# Patient Record
Sex: Male | Born: 1965 | Race: Black or African American | Hispanic: No | Marital: Married | Smoking: Current every day smoker
Health system: Southern US, Community
[De-identification: ages and names within clinical notes are randomized; demographics above are authoritative.]

## PROBLEM LIST (undated history)

## (undated) DIAGNOSIS — M1990 Unspecified osteoarthritis, unspecified site: Secondary | ICD-10-CM

## (undated) DIAGNOSIS — K4090 Unilateral inguinal hernia, without obstruction or gangrene, not specified as recurrent: Secondary | ICD-10-CM

## (undated) DIAGNOSIS — K219 Gastro-esophageal reflux disease without esophagitis: Secondary | ICD-10-CM

## (undated) DIAGNOSIS — R569 Unspecified convulsions: Secondary | ICD-10-CM

## (undated) HISTORY — PX: HERNIA REPAIR: SHX51

## (undated) HISTORY — PX: KNEE SURGERY: SHX244

## (undated) HISTORY — PX: BACK SURGERY: SHX140

---

## 2014-02-17 ENCOUNTER — Emergency Department (HOSPITAL_COMMUNITY): Payer: Medicare Other

## 2014-02-17 ENCOUNTER — Emergency Department (HOSPITAL_COMMUNITY)
Admission: EM | Admit: 2014-02-17 | Discharge: 2014-02-17 | Disposition: A | Discharge status: Home / Self Care | Payer: Medicare Other | Attending: Emergency Medicine | Admitting: Emergency Medicine

## 2014-02-17 ENCOUNTER — Encounter (HOSPITAL_COMMUNITY): Payer: Self-pay | Admitting: *Deleted

## 2014-02-17 DIAGNOSIS — R1032 Left lower quadrant pain: Secondary | ICD-10-CM | POA: Diagnosis present

## 2014-02-17 DIAGNOSIS — Z720 Tobacco use: Secondary | ICD-10-CM | POA: Insufficient documentation

## 2014-02-17 DIAGNOSIS — K922 Gastrointestinal hemorrhage, unspecified: Secondary | ICD-10-CM | POA: Diagnosis not present

## 2014-02-17 HISTORY — DX: Unspecified convulsions: R56.9

## 2014-02-17 HISTORY — DX: Unilateral inguinal hernia, without obstruction or gangrene, not specified as recurrent: K40.90

## 2014-02-17 LAB — COMPREHENSIVE METABOLIC PANEL
ALBUMIN: 3.7 g/dL (ref 3.5–5.2)
ALT: 16 U/L (ref 0–53)
AST: 24 U/L (ref 0–37)
Alkaline Phosphatase: 66 U/L (ref 39–117)
Anion gap: 6 (ref 5–15)
BUN: 10 mg/dL (ref 6–23)
CO2: 27 mmol/L (ref 19–32)
Calcium: 9 mg/dL (ref 8.4–10.5)
Chloride: 105 mEq/L (ref 96–112)
Creatinine, Ser: 0.92 mg/dL (ref 0.50–1.35)
GFR calc Af Amer: >90 mL/min (ref >90)
GFR calc non Af Amer: >90 mL/min (ref >90)
Glucose, Bld: 113 mg/dL — ABNORMAL HIGH (ref 70–99)
POTASSIUM: 3.8 mmol/L (ref 3.5–5.1)
Sodium: 138 mmol/L (ref 135–145)
Total Bilirubin: 0.2 mg/dL — ABNORMAL LOW (ref 0.3–1.2)
Total Protein: 6.8 g/dL (ref 6.0–8.3)

## 2014-02-17 LAB — CBC WITH DIFFERENTIAL/PLATELET
Basophils Absolute: 0 10*3/uL (ref 0.0–0.1)
Basophils Relative: 0 % (ref 0–1)
Eosinophils Absolute: 0.1 10*3/uL (ref 0.0–0.7)
Eosinophils Relative: 1 % (ref 0–5)
HCT: 41.7 % (ref 39.0–52.0)
Hemoglobin: 14.2 g/dL (ref 13.0–17.0)
LYMPHS PCT: 29 % (ref 12–46)
Lymphs Abs: 2.1 10*3/uL (ref 0.7–4.0)
MCH: 30.2 pg (ref 26.0–34.0)
MCHC: 34.1 g/dL (ref 30.0–36.0)
MCV: 88.7 fL (ref 78.0–100.0)
MONO ABS: 0.4 10*3/uL (ref 0.1–1.0)
MONOS PCT: 6 % (ref 3–12)
NEUTROS ABS: 4.5 10*3/uL (ref 1.7–7.7)
NEUTROS PCT: 64 % (ref 43–77)
Platelets: 241 10*3/uL (ref 150–400)
RBC: 4.7 MIL/uL (ref 4.22–5.81)
RDW: 14 % (ref 11.5–15.5)
WBC: 7.2 10*3/uL (ref 4.0–10.5)

## 2014-02-17 LAB — LIPASE, BLOOD: Lipase: 31 U/L (ref 11–59)

## 2014-02-17 LAB — URINALYSIS, ROUTINE W REFLEX MICROSCOPIC
BILIRUBIN URINE: NEGATIVE
GLUCOSE, UA: NEGATIVE mg/dL
Hgb urine dipstick: NEGATIVE
Ketones, ur: NEGATIVE mg/dL
LEUKOCYTES UA: NEGATIVE
Nitrite: NEGATIVE
PH: 7 (ref 5.0–8.0)
Protein, ur: NEGATIVE mg/dL
Specific Gravity, Urine: 1.028 (ref 1.005–1.030)
UROBILINOGEN UA: 1 mg/dL (ref 0.0–1.0)

## 2014-02-17 MED ORDER — IOHEXOL 300 MG/ML  SOLN
100.0000 mL | Freq: Once | INTRAMUSCULAR | Status: AC | PRN
  Administered 2014-02-17: 100 mL via INTRAVENOUS

## 2014-02-17 MED ORDER — IOHEXOL 300 MG/ML  SOLN
50.0000 mL | Freq: Once | INTRAMUSCULAR | Status: AC | PRN
  Administered 2014-02-17: 25 mL via ORAL

## 2014-02-17 NOTE — Discharge Instructions (Signed)
Follow-up with GI for the bleeding and abdominal pain. He will also have some swollen lymph nodes in your stomach area that need to be followed. Return to the ER for increased bleeding or lightheadedness and dizziness. Abdominal Pain Many things can cause abdominal pain. Usually, abdominal pain is not caused by a disease and will improve without treatment. It can often be observed and treated at home. Your health care provider will do a physical exam and possibly order blood tests and X-rays to help determine the seriousness of your pain. However, in many cases, more time must pass before a clear cause of the pain can be found. Before that point, your health care provider may not know if you need more testing or further treatment. HOME CARE INSTRUCTIONS  Monitor your abdominal pain for any changes. The following actions may help to alleviate any discomfort you are experiencing:  Only take over-the-counter or prescription medicines as directed by your health care provider.  Do not take laxatives unless directed to do so by your health care provider.  Try a clear liquid diet (broth, tea, or water) as directed by your health care provider. Slowly move to a bland diet as tolerated. SEEK MEDICAL CARE IF:  You have unexplained abdominal pain.  You have abdominal pain associated with nausea or diarrhea.  You have pain when you urinate or have a bowel movement.  You experience abdominal pain that wakes you in the night.  You have abdominal pain that is worsened or improved by eating food.  You have abdominal pain that is worsened with eating fatty foods.  You have a fever. SEEK IMMEDIATE MEDICAL CARE IF:   Your pain does not go away within 2 hours.  You keep throwing up (vomiting).  Your pain is felt only in portions of the abdomen, such as the right side or the left lower portion of the abdomen.  You pass bloody or black tarry stools. MAKE SURE YOU:  Understand these instructions.    Will watch your condition.   Will get help right away if you are not doing well or get worse.  Document Released: 11/14/2004 Document Revised: 02/09/2013 Document Reviewed: 10/14/2012 [facility] Patient Information 2015 [place], [place]. This information is not intended to replace advice given to you by your health care provider. Make sure you discuss any questions you have with your health care provider.  Gastrointestinal Bleeding Gastrointestinal (GI) bleeding means there is bleeding somewhere along the digestive tract, between the mouth and anus. CAUSES  There are many different problems that can cause GI bleeding. Possible causes include:  Esophagitis. This is inflammation, irritation, or swelling of the esophagus.  Hemorrhoids.These are veins that are full of blood (engorged) in the rectum. They cause pain, inflammation, and may bleed.  Anal fissures.These are areas of painful tearing which may bleed. They are often caused by passing hard stool.  Diverticulosis.These are pouches that form on the colon over time, with age, and may bleed significantly.  Diverticulitis.This is inflammation in areas with diverticulosis. It can cause pain, fever, and bloody stools, although bleeding is rare.  Polyps and cancer. Colon cancer often starts out as precancerous polyps.  Gastritis and ulcers.Bleeding from the upper gastrointestinal tract (near the stomach) may travel through the intestines and produce black, sometimes tarry, often bad smelling stools. In certain cases, if the bleeding is fast enough, the stools may not be black, but red. This condition may be life-threatening. SYMPTOMS   Vomiting bright red blood or material that looks  like coffee grounds.  Bloody, black, or tarry stools. DIAGNOSIS  Your caregiver may diagnose your condition by taking your history and performing a physical exam. More tests may be needed, including:  X-rays and other imaging  tests.  Esophagogastroduodenoscopy (EGD). This test uses a flexible, lighted tube to look at your esophagus, stomach, and small intestine.  Colonoscopy. This test uses a flexible, lighted tube to look at your colon. TREATMENT  Treatment depends on the cause of your bleeding.   For bleeding from the esophagus, stomach, small intestine, or colon, the caregiver doing your EGD or colonoscopy may be able to stop the bleeding as part of the procedure.  Inflammation or infection of the colon can be treated with medicines.  Many rectal problems can be treated with creams, suppositories, or warm baths.  Surgery is sometimes needed.  Blood transfusions are sometimes needed if you have lost a lot of blood. If bleeding is slow, you may be allowed to go home. If there is a lot of bleeding, you will need to stay in the hospital for observation. HOME CARE INSTRUCTIONS   Take any medicines exactly as prescribed.  Keep your stools soft by eating foods that are high in fiber. These foods include whole grains, legumes, fruits, and vegetables. Prunes (1 to 3 a day) work well for many people.  Drink enough fluids to keep your urine clear or pale yellow. SEEK IMMEDIATE MEDICAL CARE IF:   Your bleeding increases.  You feel lightheaded, weak, or you faint.  You have severe cramps in your back or abdomen.  You pass large blood clots in your stool.  Your problems are getting worse. MAKE SURE YOU:   Understand these instructions.  Will watch your condition.  Will get help right away if you are not doing well or get worse. Document Released: 02/02/2000 Document Revised: 01/22/2012 Document Reviewed: 01/14/2011 [facility] Patient Information 2015 [place], [place]. This information is not intended to replace advice given to you by your health care provider. Make sure you discuss any questions you have with your health care provider.

## 2014-02-17 NOTE — ED Provider Notes (Signed)
CSN: [id number]     Arrival date & time 02/17/14  1548 History   First MD Initiated Contact with Patient 02/17/14 1835     Chief Complaint  Patient presents with  . Rectal Bleeding  . Abdominal Pain     (Consider location/radiation/quality/duration/timing/severity/associated sxs/prior Treatment) Patient is a 48 y.o. male presenting with hematochezia and abdominal pain. The history is provided by the patient.  Rectal Bleeding Associated symptoms: abdominal pain   Associated symptoms: no vomiting   Abdominal Pain Associated symptoms: hematochezia   Associated symptoms: no chest pain, no diarrhea, no nausea, no shortness of breath and no vomiting    patient has had dull left lower abdominal pain today. He states he had red blood in the stool this morning. He states he is not going to the bathroom since. He states he's been afraid to eat. No lightheadedness or dizziness. No bleeding. No nausea or vomiting. He has not had episodes like this before. No weight loss. He is a smoker. He states there was blood with wiping and in the bowl. He states he could not tell what color the stool was.  Past Medical History  Diagnosis Date  . Hernia, inguinal, right   . Seizures    Past Surgical History  Procedure Laterality Date  . Knee surgery    . Back surgery     History reviewed. No pertinent family history. History  Substance Use Topics  . Smoking status: Current Every Day Smoker    Types: Cigarettes  . Smokeless tobacco: Not on file  . Alcohol Use: No    Review of Systems  Constitutional: Negative for activity change and appetite change.  Eyes: Negative for pain.  Respiratory: Negative for chest tightness and shortness of breath.   Cardiovascular: Negative for chest pain and leg swelling.  Gastrointestinal: Positive for abdominal pain, blood in stool and hematochezia. Negative for nausea, vomiting and diarrhea.  Genitourinary: Negative for flank pain.  Musculoskeletal: Negative for  back pain and neck stiffness.  Skin: Negative for rash.  Neurological: Negative for weakness, numbness and headaches.  Psychiatric/Behavioral: Negative for behavioral problems.      Allergies  Review of patient's allergies indicates no known allergies.  Home Medications   Prior to Admission medications   Not on File   BP 93/53 mmHg  Pulse 72  Temp(Src) 98.5 F (36.9 C) (Oral)  Resp 18  SpO2 98% Physical Exam  Constitutional: He is oriented to person, place, and time. He appears well-developed and well-nourished.  HENT:  Head: Normocephalic and atraumatic.  Eyes: EOM are normal. Pupils are equal, round, and reactive to light.  Neck: Normal range of motion. Neck supple.  Cardiovascular: Normal rate, regular rhythm and normal heart sounds.   No murmur heard. Pulmonary/Chest: Effort normal and breath sounds normal.  Abdominal: Soft. Bowel sounds are normal. He exhibits no distension and no mass. There is tenderness. There is no rebound and no guarding.  Mild left lower quadrant tenderness without rebound or guarding.  Musculoskeletal: Normal range of motion. He exhibits no edema.  Neurological: He is alert and oriented to person, place, and time. No cranial nerve deficit.  Skin: Skin is warm and dry. No pallor.  Psychiatric: He has a normal mood and affect.  Nursing note and vitals reviewed.   ED Course  Procedures (including critical care time) Labs Review Labs Reviewed  COMPREHENSIVE METABOLIC PANEL - Abnormal; Notable for the following:    Glucose, Bld 113 (*)    Total Bilirubin 0.2 (*)  All other components within normal limits  CBC WITH DIFFERENTIAL  URINALYSIS, ROUTINE W REFLEX MICROSCOPIC  LIPASE, BLOOD    Imaging Review Ct Abdomen Pelvis W Contrast  02/17/2014   CLINICAL DATA:  Left lower quadrant pain.  GI bleeding.  EXAM: CT ABDOMEN AND PELVIS WITH CONTRAST  TECHNIQUE: Multidetector CT imaging of the abdomen and pelvis was performed using the standard  protocol following bolus administration of intravenous contrast.  CONTRAST:  100mL OMNIPAQUE IOHEXOL 300 MG/ML  SOLN  COMPARISON:  None.  FINDINGS: BODY WALL: Left inguinal hernia repair. No recurrent to explain left lower quadrant pain.  LOWER CHEST: Unremarkable.  ABDOMEN/PELVIS:  Liver: No focal abnormality.  Biliary: No evidence of biliary obstruction or stone.  Pancreas: Unremarkable.  Spleen: Unremarkable.  Adrenals: Unremarkable.  Kidneys and ureters: No hydronephrosis or stone.  Bladder: Unremarkable.  Reproductive: Unremarkable.  Bowel: There is adenopathy surrounding the stomach, both in the gastrohepatic ligament and along the greater curvature, especially in the antral region. Nodes measure up to 14 mm in short axis. The stomach is partially collapsed, but there is no concerning wall thickening. There is no generalized adenopathy or splenomegaly suggestive of a lymphomatous process. Normal appendix. Negative distal colon.  Peritoneum: No ascites or pneumoperitoneum.  Vascular: No acute abnormality.  OSSEOUS: Focally severe degenerative disc and facet disease at L4-5 with slip and bulging of the uncovered disc. The posterior elements are over grown and contribute to high-grade canal stenosis at this level.  IMPRESSION: 1. No findings to explain left lower quadrant abdominal pain. 2. Adenopathy in the gastric drainage without visible cause. Recommend outpatient imaging correlation to determine the acuity of this finding. If no previous imaging available, recommend follow-up CT in 3 months and consideration of gastric endoscopy. 3. Focally severe degenerative disc disease at L4-5 with high-grade canal stenosis.   Electronically Signed   By: Tiburcio PeaJonathan  Watts M.D.   On: 02/17/2014 21:32     EKG Interpretation None      MDM   Final diagnoses:  LLQ pain  GI bleeding    Patient with left lower quadrant pain and GI bleeding. CT reassuring along with reassuring lab work. Patient does not feel  lightheaded or dizzy. CT scan done and shows some lymph nodes swollen in the gastric area. Patient will follow-up with GI. Was given the information that this could be malignant cause for the lymph nodes.    Juliet RudeNathan R. Rubin PayorPickering, MD 02/17/14 2253

## 2014-02-17 NOTE — ED Notes (Signed)
Pt reports having some abd pain this am, had bowel movement and noted blood in stools and when he wiped. No acute distress noted at triage.

## 2014-04-26 ENCOUNTER — Other Ambulatory Visit: Payer: Self-pay | Admitting: Neurosurgery

## 2014-04-26 DIAGNOSIS — M4316 Spondylolisthesis, lumbar region: Secondary | ICD-10-CM

## 2014-05-23 ENCOUNTER — Ambulatory Visit
Admission: RE | Admit: 2014-05-23 | Discharge: 2014-05-23 | Disposition: A | Discharge status: Home / Self Care | Payer: Medicaid Other | Source: Ambulatory Visit | Attending: Neurosurgery | Admitting: Neurosurgery

## 2014-05-23 ENCOUNTER — Ambulatory Visit
Admission: RE | Admit: 2014-05-23 | Discharge: 2014-05-23 | Disposition: A | Discharge status: Home / Self Care | Payer: Medicare Other | Source: Ambulatory Visit | Attending: Neurosurgery | Admitting: Neurosurgery

## 2014-05-23 DIAGNOSIS — M4316 Spondylolisthesis, lumbar region: Secondary | ICD-10-CM

## 2014-05-23 MED ORDER — MEPERIDINE HCL 100 MG/ML IJ SOLN
75.0000 mg | Freq: Once | INTRAMUSCULAR | Status: AC
  Administered 2014-05-23: 75 mg via INTRAMUSCULAR

## 2014-05-23 MED ORDER — ONDANSETRON HCL 4 MG/2ML IJ SOLN
4.0000 mg | Freq: Once | INTRAMUSCULAR | Status: AC
  Administered 2014-05-23: 4 mg via INTRAMUSCULAR

## 2014-05-23 MED ORDER — DIAZEPAM 5 MG PO TABS
5.0000 mg | ORAL_TABLET | Freq: Once | ORAL | Status: AC
  Administered 2014-05-23: 5 mg via ORAL

## 2014-05-23 MED ORDER — IOHEXOL 180 MG/ML  SOLN
15.0000 mL | Freq: Once | INTRAMUSCULAR | Status: AC | PRN
  Administered 2014-05-23: 13 mL via INTRATHECAL

## 2014-05-23 NOTE — Discharge Instructions (Signed)

## 2015-09-11 ENCOUNTER — Encounter (HOSPITAL_COMMUNITY): Payer: Self-pay | Admitting: Emergency Medicine

## 2015-09-11 ENCOUNTER — Ambulatory Visit (HOSPITAL_COMMUNITY)
Admission: EM | Admit: 2015-09-11 | Discharge: 2015-09-11 | Disposition: A | Discharge status: Home / Self Care | Payer: Medicare Other | Attending: Emergency Medicine | Admitting: Emergency Medicine

## 2015-09-11 DIAGNOSIS — T63461A Toxic effect of venom of wasps, accidental (unintentional), initial encounter: Secondary | ICD-10-CM | POA: Diagnosis not present

## 2015-09-11 MED ORDER — METHYLPREDNISOLONE ACETATE 80 MG/ML IJ SUSP
INTRAMUSCULAR | Status: AC
  Filled 2015-09-11: qty 1

## 2015-09-11 MED ORDER — TRAMADOL HCL 50 MG PO TABS: 50.0000 mg | ORAL_TABLET | Freq: Four times a day (QID) | ORAL | 0 refills | Status: DC | PRN

## 2015-09-11 MED ORDER — METHYLPREDNISOLONE ACETATE 80 MG/ML IJ SUSP
80.0000 mg | Freq: Once | INTRAMUSCULAR | Status: AC
  Administered 2015-09-11: 80 mg via INTRAMUSCULAR

## 2015-09-11 NOTE — Discharge Instructions (Signed)
You are having a local reaction to the wasp sting. We gave you a shot here to help. Keep your hand elevated and apply ice as much as you can for the next 24 hours. Take Tylenol or ibuprofen as needed for pain. Use the tramadol every 6-8 hours as needed for severe pain. If you develop trouble breathing or feeling like your throat is closing, please call 911 or go straight to the emergency room.

## 2015-09-11 NOTE — ED Provider Notes (Signed)
[facility]    CSN: [id number] Arrival date & time: 09/11/15  1512  First Provider Contact:  First MD Initiated Contact with Patient 09/11/15 1610        History   Chief Complaint Chief Complaint  Patient presents with  . Insect Bite    HPI Timothy Massey is a 50 y.o. male.   He is a 50 year old man here for evaluation of wasp sting. He states this occurred earlier this afternoon. He was stung on the left radial hand. He has had pain and swelling despite applying ice and onion juice. He denies any shortness of breath or wheezing. No throat pain or difficulty swallowing. No sensation of throat closing.      Past Medical History:  Diagnosis Date  . Hernia, inguinal, right   . Seizures (HCC)     There are no active problems to display for this patient.   Past Surgical History:  Procedure Laterality Date  . BACK SURGERY    . KNEE SURGERY         Home Medications    Prior to Admission medications   Medication Sig Start Date End Date Taking? Authorizing Provider  gabapentin (NEURONTIN) 300 MG capsule Take 300 mg by mouth 3 (three) times daily.   Yes Historical Provider, MD  HYDROcodone-acetaminophen (NORCO/VICODIN) 5-325 MG tablet Take 1 tablet by mouth every 6 (six) hours as needed for moderate pain.   Yes Historical Provider, MD  ibuprofen (ADVIL,MOTRIN) 800 MG tablet Take 800 mg by mouth every 8 (eight) hours as needed.   Yes Historical Provider, MD  traMADol (ULTRAM) 50 MG tablet Take 1 tablet (50 mg total) by mouth every 6 (six) hours as needed. 09/11/15   Charm Rings, MD    Family History No family history on file.  Social History Social History  Substance Use Topics  . Smoking status: Current Every Day Smoker    Types: Cigarettes  . Smokeless tobacco: Not on file  . Alcohol use No     Allergies   Review of patient's allergies indicates no known allergies.   Review of Systems Review of Systems  Constitutional: Negative for fever.    HENT: Negative for sore throat and trouble swallowing.   Respiratory: Negative for shortness of breath.   Musculoskeletal:       Left hand pain     Physical Exam Triage Vital Signs ED Triage Vitals  Enc Vitals Group     BP 09/11/15 1550 129/76     Pulse Rate 09/11/15 1550 85     Resp 09/11/15 1550 16     Temp 09/11/15 1550 98.6 F (37 C)     Temp Source 09/11/15 1550 Oral     SpO2 09/11/15 1550 100 %     Weight --      Height --      Head Circumference --      Peak Flow --      Pain Score 09/11/15 1601 9     Pain Loc --      Pain Edu? --      Excl. in GC? --    No data found.   Updated Vital Signs BP 129/76 (BP Location: Left Arm)   Pulse 85   Temp 98.6 F (37 C) (Oral)   Resp 16   SpO2 100%   Visual Acuity Right Eye Distance:   Left Eye Distance:   Bilateral Distance:    Right Eye Near:   Left Eye Near:  Bilateral Near:     Physical Exam  Constitutional: He is oriented to person, place, and time. He appears well-developed and well-nourished. No distress.  Cardiovascular: Normal rate.   Pulmonary/Chest: Effort normal.  Neurological: He is alert and oriented to person, place, and time.  Skin:  Swelling and mild redness of the left dorsal hand. Site of sting examined and no stinger identified. Swelling extends into the wrist slightly.     UC Treatments / Results  Labs (all labs ordered are listed, but only abnormal results are displayed) Labs Reviewed - No data to display  EKG  EKG Interpretation None       Radiology No results found.  Procedures Procedures (including critical care time)  Medications Ordered in UC Medications  methylPREDNISolone acetate (DEPO-MEDROL) injection 80 mg (80 mg Intramuscular Given 09/11/15 1630)     Initial Impression / Assessment and Plan / UC Course  I have reviewed the triage vital signs and the nursing notes.  Pertinent labs & imaging results that were available during my care of the patient were  reviewed by me and considered in my medical decision making (see chart for details).  Clinical Course    Exam is consistent with local reaction to wasp sting. Will treat with Depo-Medrol here. Recommended ice and elevation to help with the swelling. Tylenol or ibuprofen as needed for pain. Prescription given for tramadol she is as needed for severe pain. Precautions reviewed.  Final Clinical Impressions(s) / UC Diagnoses   Final diagnoses:  Wasp sting, accidental or unintentional, initial encounter    New Prescriptions New Prescriptions   TRAMADOL (ULTRAM) 50 MG TABLET    Take 1 tablet (50 mg total) by mouth every 6 (six) hours as needed.     Charm Rings, MD 09/11/15 [phone]

## 2015-09-12 ENCOUNTER — Ambulatory Visit (HOSPITAL_COMMUNITY)
Admission: EM | Admit: 2015-09-12 | Discharge: 2015-09-12 | Disposition: A | Discharge status: Home / Self Care | Payer: Medicare Other | Attending: Internal Medicine | Admitting: Internal Medicine

## 2015-09-12 ENCOUNTER — Encounter (HOSPITAL_COMMUNITY): Payer: Self-pay | Admitting: *Deleted

## 2015-09-12 DIAGNOSIS — T63441D Toxic effect of venom of bees, accidental (unintentional), subsequent encounter: Secondary | ICD-10-CM | POA: Diagnosis not present

## 2015-09-12 MED ORDER — METHYLPREDNISOLONE SODIUM SUCC 125 MG IJ SOLR
INTRAMUSCULAR | Status: AC
  Filled 2015-09-12: qty 2

## 2015-09-12 MED ORDER — METHYLPREDNISOLONE SODIUM SUCC 125 MG IJ SOLR
125.0000 mg | Freq: Once | INTRAMUSCULAR | Status: AC
  Administered 2015-09-12: 125 mg via INTRAMUSCULAR

## 2015-09-12 MED ORDER — PREDNISONE 50 MG PO TABS: 50.0000 mg | ORAL_TABLET | Freq: Every day | ORAL | 0 refills | Status: AC

## 2015-09-12 NOTE — ED Provider Notes (Signed)
[facility]    CSN: [id number] Arrival date & time: 09/12/15  1343  First Provider Contact:  First MD Initiated Contact with Patient 09/12/15 1503        History   Chief Complaint Chief Complaint  Patient presents with  . Insect Bite    HPI Timothy Massey is a 50 y.o. male. He was seen at the urgent care yesterday with significant local reaction to a wasp sting to the left hand. He was treated with Solu-Medrol 80 mg IM.  He re-presents today with increased swelling, redness, itching, extending a couple inches up the forearm. No shortness of breath, not coughing/wheezing. No nausea, no GI distress.  HPI  Past Medical History:  Diagnosis Date  . Hernia, inguinal, right   . Seizures (HCC)     There are no active problems to display for this patient.   Past Surgical History:  Procedure Laterality Date  . BACK SURGERY    . KNEE SURGERY         Home Medications    Prior to Admission medications   Medication Sig Start Date End Date Taking? Authorizing Provider  gabapentin (NEURONTIN) 300 MG capsule Take 300 mg by mouth 3 (three) times daily.    Historical Provider, MD  HYDROcodone-acetaminophen (NORCO/VICODIN) 5-325 MG tablet Take 1 tablet by mouth every 6 (six) hours as needed for moderate pain.    Historical Provider, MD  ibuprofen (ADVIL,MOTRIN) 800 MG tablet Take 800 mg by mouth every 8 (eight) hours as needed.    Historical Provider, MD  traMADol (ULTRAM) 50 MG tablet Take 1 tablet (50 mg total) by mouth every 6 (six) hours as needed. 09/11/15   Charm Rings, MD    Family History History reviewed. No pertinent family history.  Social History Social History  Substance Use Topics  . Smoking status: Current Every Day Smoker    Types: Cigarettes  . Smokeless tobacco: Not on file  . Alcohol use No     Allergies   Review of patient's allergies indicates no known allergies.   Review of Systems Review of Systems  All other systems reviewed and are  negative.    Physical Exam Triage Vital Signs ED Triage Vitals  Enc Vitals Group     BP 09/12/15 1432 131/87     Pulse Rate 09/12/15 1432 80     Resp 09/12/15 1432 18     Temp 09/12/15 1432 98 F (36.7 C)     Temp Source 09/12/15 1432 Oral     SpO2 09/12/15 1432 97 %     Weight 09/12/15 1432 185 lb (83.9 kg)     Height 09/12/15 1432 6' (1.829 m)     Pain Score 09/12/15 1439 5    Updated Vital Signs BP 131/87 (BP Location: Right Arm)   Pulse 80   Temp 98 F (36.7 C) (Oral)   Resp 18   Ht 6' (1.829 m)   Wt 185 lb (83.9 kg)   SpO2 97%   BMI 25.09 kg/m  Physical Exam  Constitutional: He appears well-developed and well-nourished.  HENT:  Head: Normocephalic and atraumatic.  Eyes: Conjunctivae are normal.  Neck: Neck supple.  Cardiovascular: Normal rate and regular rhythm.   No murmur heard. Pulmonary/Chest: Effort normal and breath sounds normal. No respiratory distress.  Abdominal: He exhibits no distension.  Musculoskeletal: He exhibits no edema.       Arms: Neurological: He is alert.  Skin: Skin is warm and dry.  Diffuse moderately  severe swelling/redness, itching, of the left hand and distal forearm. Skin is intact, no seeping.  Psychiatric: He has a normal mood and affect.  Nursing note and vitals reviewed.    UC Treatments / Results  Labs (all labs ordered are listed, but only abnormal results are displayed) Labs Reviewed - No data to display  EKG  EKG Interpretation None       Radiology No results found.  Procedures Procedures (including critical care time) none  Medications Ordered in UC Medications  methylPREDNISolone sodium succinate (SOLU-MEDROL) 125 mg/2 mL injection 125 mg (125 mg Intramuscular Given 09/12/15 1522)       Final Clinical Impressions(s) / UC Diagnoses   Final diagnoses:  Bee sting reaction, accidental or unintentional, subsequent encounter  Moderately severe local reaction  New Prescriptions Discharge Medication  List as of 09/12/2015  3:15 PM    START taking these medications   Details  predniSONE (DELTASONE) 50 MG tablet Take 1 tablet (50 mg total) by mouth daily., Starting Tue 09/12/2015, Until Tue 09/19/2015, Normal         Eustace Moore, MD 09/21/15 [phone]

## 2015-09-12 NOTE — Discharge Instructions (Signed)
Anticipate gradual improvement in itching/swelling/redness over the next several days.  If significant increase in redness/swelling occur, recheck at urgent care tomorrow or go to ED.  If swelling is not starting to decrease in the next 48 hours, recheck at urgent care or followup primary care provider, [facility].  Prescription for prednisone sent to the [facility] on [facility].

## 2016-01-01 ENCOUNTER — Ambulatory Visit (HOSPITAL_COMMUNITY)
Admission: EM | Admit: 2016-01-01 | Discharge: 2016-01-01 | Disposition: A | Discharge status: Home / Self Care | Payer: Medicare Other | Attending: Emergency Medicine | Admitting: Emergency Medicine

## 2016-01-01 ENCOUNTER — Encounter (HOSPITAL_COMMUNITY): Payer: Self-pay | Admitting: Emergency Medicine

## 2016-01-01 DIAGNOSIS — R209 Unspecified disturbances of skin sensation: Secondary | ICD-10-CM

## 2016-01-01 DIAGNOSIS — M62838 Other muscle spasm: Secondary | ICD-10-CM | POA: Diagnosis not present

## 2016-01-01 DIAGNOSIS — F688 Other specified disorders of adult personality and behavior: Secondary | ICD-10-CM | POA: Diagnosis not present

## 2016-01-01 DIAGNOSIS — M79604 Pain in right leg: Secondary | ICD-10-CM | POA: Diagnosis not present

## 2016-01-01 MED ORDER — BACLOFEN 10 MG PO TABS: 10.0000 mg | ORAL_TABLET | Freq: Three times a day (TID) | ORAL | 0 refills | Status: DC

## 2016-01-01 NOTE — Discharge Instructions (Signed)
The muscle pain in the right thigh is likely due to muscle spasms. The sensory deficits to the leg below the knee is likely due to nerve damage. Suspect this may be a nerve originating from the spine of the lower back. It is very important for you to call your neurosurgeon tomorrow for follow-up as soon as possible.

## 2016-01-01 NOTE — ED Triage Notes (Signed)
Right leg pain.  Reports right knee gave away.  Patient has pain in thigh and numbness in lower leg.  Patient did not fall tio the ground at any time

## 2016-01-01 NOTE — ED Provider Notes (Signed)
CSN: [id number]     Arrival date & time 01/01/16  1532 History   First MD Initiated Contact with Patient 01/01/16 1752     Chief Complaint  Patient presents with  . Leg Pain   (Consider location/radiation/quality/duration/timing/severity/associated sxs/prior Treatment) [age]-year-old male with history of previous spinal injury was subsequent surgery 2 to his lumbar spine states that approximately 2 weeks ago he turned at home and almost fail experiencing pain in the quadriceps muscles and numbness in the right lower extremity below the knee. The pain has been getting worse in his anterior quadricep muscles. His analgesics include hydrocodone, gabapentin and Motrin for which he has increased because of the pain in the thigh. He also states that there is increased weakness in the right lower extremity.      Past Medical History:  Diagnosis Date  . Hernia, inguinal, right   . Seizures (HCC)    Past Surgical History:  Procedure Laterality Date  . BACK SURGERY    . KNEE SURGERY     No family history on file. Social History  Substance Use Topics  . Smoking status: Current Every Day Smoker    Types: Cigarettes  . Smokeless tobacco: Not on file  . Alcohol use No    Review of Systems  Constitutional: Positive for activity change. Negative for fatigue and fever.  Respiratory: Negative.   Gastrointestinal: Negative.   Genitourinary: Negative.   Musculoskeletal: Positive for gait problem and myalgias. Negative for joint swelling.       As per HPI  Skin: Negative.   Neurological: Negative for dizziness, weakness, numbness and headaches.  All other systems reviewed and are negative.   Allergies  Patient has no known allergies.  Home Medications   Prior to Admission medications   Medication Sig Start Date End Date Taking? Authorizing Provider  baclofen (LIORESAL) 10 MG tablet Take 1 tablet (10 mg total) by mouth 3 (three) times daily. Prn muscle spasm 01/01/16   Hayden Rasmussenavid Winnifred Dufford, NP   gabapentin (NEURONTIN) 300 MG capsule Take 300 mg by mouth 3 (three) times daily.    Historical Provider, MD  HYDROcodone-acetaminophen (NORCO/VICODIN) 5-325 MG tablet Take 1 tablet by mouth every 6 (six) hours as needed for moderate pain.    Historical Provider, MD  ibuprofen (ADVIL,MOTRIN) 800 MG tablet Take 800 mg by mouth every 8 (eight) hours as needed.    Historical Provider, MD   Meds Ordered and Administered this Visit  Medications - No data to display  BP 118/77 (BP Location: Right Arm)   Pulse 79   Temp 98.1 F (36.7 C) (Oral)   Resp 16   SpO2 99%  No data found.   Physical Exam  Constitutional: He is oriented to person, place, and time. He appears well-developed and well-nourished.  HENT:  Head: Normocephalic and atraumatic.  Eyes: EOM are normal. Left eye exhibits no discharge.  Neck: Normal range of motion. Neck supple.  Cardiovascular: Normal rate.   Pulmonary/Chest: Effort normal.  Musculoskeletal: He exhibits no edema or deformity.  Tenderness to the right quadricep muscles. Decreased sensitivity below the knee. Using a small foot and stick several punctures to the right lower extremity below the knee, anterior medial and lateral aspect with little to no pain sensation. Percussion provide's minimal sensation of vibration. No pain sensation. Distal vascular and motor intact although some weakness with plantar and dorsiflexion. Patient is able to ambulate with a cane but notes the right lower extremity is weaker than usual. Patellar reflexes 1+ with  contraction of the quadriceps, same in the left. Distal warmth and color intact. No edema.  Neurological: He is alert and oriented to person, place, and time. A sensory deficit is present. No cranial nerve deficit. He exhibits abnormal muscle tone.  Skin: Skin is warm and dry.  Psychiatric: He has a normal mood and affect.  Nursing note and vitals reviewed.   Urgent Care Course   Clinical Course     Procedures  (including critical care time)  Labs Review Labs Reviewed - No data to display  Imaging Review No results found.   Visual Acuity Review  Right Eye Distance:   Left Eye Distance:   Bilateral Distance:    Right Eye Near:   Left Eye Near:    Bilateral Near:         MDM   1. Right leg pain   2. Muscle spasm of right leg   3. Deficit in sensory perception    The muscle pain in the right thigh is likely due to muscle spasms. The sensory deficits to the leg below the knee is likely due to nerve damage. Suspect this may be a nerve originating from the spine of the lower back. It is very important for you to call your neurosurgeon tomorrow for follow-up as soon as possible. Meds ordered this encounter  Medications  . baclofen (LIORESAL) 10 MG tablet    Sig: Take 1 tablet (10 mg total) by mouth 3 (three) times daily. Prn muscle spasm    Dispense:  30 each    Refill:  0    Order Specific Question:   Supervising Provider    Answer:   Domenick GongMORTENSON, ASHLEY [[id number]]       Hayden Rasmussenavid Lagina Reader, NP 01/01/16 1827

## 2016-01-08 ENCOUNTER — Encounter (HOSPITAL_COMMUNITY): Payer: Self-pay | Admitting: Emergency Medicine

## 2016-01-08 ENCOUNTER — Emergency Department (HOSPITAL_COMMUNITY)
Admission: EM | Admit: 2016-01-08 | Discharge: 2016-01-08 | Disposition: A | Discharge status: Home / Self Care | Payer: Medicare Other | Attending: Emergency Medicine | Admitting: Emergency Medicine

## 2016-01-08 ENCOUNTER — Emergency Department (HOSPITAL_COMMUNITY): Payer: Medicare Other

## 2016-01-08 DIAGNOSIS — F17210 Nicotine dependence, cigarettes, uncomplicated: Secondary | ICD-10-CM | POA: Insufficient documentation

## 2016-01-08 DIAGNOSIS — M5442 Lumbago with sciatica, left side: Secondary | ICD-10-CM | POA: Diagnosis not present

## 2016-01-08 DIAGNOSIS — M5441 Lumbago with sciatica, right side: Secondary | ICD-10-CM | POA: Diagnosis not present

## 2016-01-08 DIAGNOSIS — G8929 Other chronic pain: Secondary | ICD-10-CM | POA: Diagnosis not present

## 2016-01-08 DIAGNOSIS — M545 Low back pain: Secondary | ICD-10-CM | POA: Diagnosis present

## 2016-01-08 MED ORDER — MORPHINE SULFATE (PF) 4 MG/ML IV SOLN
4.0000 mg | Freq: Once | INTRAVENOUS | Status: AC
  Administered 2016-01-08: 4 mg via INTRAVENOUS
  Filled 2016-01-08: qty 1

## 2016-01-08 MED ORDER — PREDNISONE 10 MG PO TABS: ORAL_TABLET | ORAL | 0 refills | Status: DC

## 2016-01-08 NOTE — ED Notes (Signed)
To MRI

## 2016-01-08 NOTE — Discharge Instructions (Signed)
I have given you a prescription for prednisone. Please take as prescribed. Your MRI shows chronic changes with bulging discs. Please follow-up with your neurosurgeon Dr. Ollen BowlHarkins. Please return to the ED if your symptoms worsen or if you're unable to urinate, lose control of her bowel or bladder, have fevers or for any other reason..Timothy Massey

## 2016-01-08 NOTE — ED Triage Notes (Signed)
Pt reports 2 weeks of bilateral leg pain has seen his back surgeon and was told to go to urgent care where he was seen for pain medicine and has no improvement in pain. Pt reports 2 weeks of numbness on the tops of his shins. Pt is tearful in triage.

## 2016-01-08 NOTE — ED Notes (Signed)
Pt transported to MRI 

## 2016-01-08 NOTE — ED Notes (Signed)
ED Provider at bedside. 

## 2016-01-08 NOTE — ED Notes (Signed)
Pt sobbing stating that his back is hurting radiates down both legs-- 2 surgeries in past. Had back last injected in October, has been taking baclofen and norco without relief. Still has meds left. No incontinence.

## 2016-01-08 NOTE — ED Provider Notes (Signed)
[facility]-EMERGENCY DEPT Provider Note   CSN: [id number] Arrival date & time: 01/08/16  [id number]     History   Chief Complaint Chief Complaint  Patient presents with  . Leg Pain  . Back Pain  . leg numbness    HPI Timothy Massey is a 50 y.o. male.  50 year old African-American male with a past medical history significant for chronic low back pain with spinal surgery x 2 to lumbar spine that presents to the ED today with low back pain and bilateral leg pain. Patient states that his leg pain started approximately 2 weeks ago. The pain was so severe that it almost caused him to fall. Patient states the pain starts in his lower back and radiates down bilateral lateral thighs to his knees. Patient states that he had a steroid injection in his back in October by his neurosurgeon. Patient states that when his leg pain started he caught his neurosurgeon who told to go to urgent care. Patient was seen at urgent care and diagnosed with radicular pain. He was given a prescription for baclofen. He also has prescription for Norco which he takes at home for pain as needed. Patient states that Norco is not helping his pain. States the pain is constant and gradually worsening. Moving makes the pain worse. Nothing makes pain better. He is able to ambulate but with significant amount of pain. He also reports numbness in his shins bilaterally and weakness on his right leg. He denies any fever, chills, headache, vision changes, lightheadedness, dizziness, chest pain, shortness of breath, abdominal pain, nausea, vomiting, change in bowel habits, dysuria, hematuria, urgency, frequency urinary retention, incontinence of bowel or bladder, saddle paresthesias, history of IV drug use, history of cancer.        Past Medical History:  Diagnosis Date  . Hernia, inguinal, right   . Seizures (HCC)     There are no active problems to display for this patient.   Past Surgical History:  Procedure Laterality Date  .  BACK SURGERY    . KNEE SURGERY         Home Medications    Prior to Admission medications   Medication Sig Start Date End Date Taking? Authorizing Provider  baclofen (LIORESAL) 10 MG tablet Take 1 tablet (10 mg total) by mouth 3 (three) times daily. Prn muscle spasm 01/01/16   Hayden Rasmussenavid Mabe, NP  gabapentin (NEURONTIN) 300 MG capsule Take 300 mg by mouth 3 (three) times daily.    Historical Provider, MD  HYDROcodone-acetaminophen (NORCO/VICODIN) 5-325 MG tablet Take 1 tablet by mouth every 6 (six) hours as needed for moderate pain.    Historical Provider, MD  ibuprofen (ADVIL,MOTRIN) 800 MG tablet Take 800 mg by mouth every 8 (eight) hours as needed.    Historical Provider, MD    Family History No family history on file.  Social History Social History  Substance Use Topics  . Smoking status: Current Every Day Smoker    Types: Cigarettes  . Smokeless tobacco: Not on file  . Alcohol use No     Allergies   Patient has no known allergies.   Review of Systems Review of Systems  Constitutional: Negative for chills and fever.  HENT: Negative for congestion, ear pain, rhinorrhea and sore throat.   Eyes: Negative for pain and discharge.  Respiratory: Negative for cough and shortness of breath.   Cardiovascular: Negative for chest pain, palpitations and leg swelling.  Gastrointestinal: Negative for abdominal pain, diarrhea, nausea and vomiting.  Genitourinary: Negative for  flank pain, frequency, hematuria and urgency.  Musculoskeletal: Positive for back pain and gait problem. Negative for myalgias and neck pain.  Skin: Negative.   Neurological: Positive for weakness and numbness. Negative for dizziness, syncope, light-headedness and headaches.  All other systems reviewed and are negative.    Physical Exam Updated Vital Signs BP 134/99 (BP Location: Right Arm)   Pulse 86   Temp 98 F (36.7 C) (Oral)   Resp 16   SpO2 99%   Physical Exam  Constitutional: He appears  well-developed and well-nourished. No distress.  HENT:  Head: Normocephalic and atraumatic.  Mouth/Throat: Oropharynx is clear and moist.  Eyes: Conjunctivae are normal. Right eye exhibits no discharge. Left eye exhibits no discharge. No scleral icterus.  Neck: Normal range of motion. Neck supple. No thyromegaly present.  Cardiovascular: Normal rate, regular rhythm, normal heart sounds and intact distal pulses.   Pulmonary/Chest: Effort normal and breath sounds normal.  Abdominal: Soft. Bowel sounds are normal. He exhibits no distension. There is no tenderness.  Musculoskeletal: Normal range of motion.  the patient is moving all 4 extremities without any difficulties. No L-spine midline tenderness. No L-spine paraspinal tenderness. Unable to assess range of motion due to pain and lower extremities. Tenderness to palpation of the lateral thighs bilaterally. DP pulses are 2+ bilaterally. Cap refill is normal.   Positive straight leg raise bilaterally that reproduces pain and numbness down to the toes.  Lymphadenopathy:    He has no cervical adenopathy.  Neurological: He is alert.  The patient is alert, attentive, and oriented x 3. Speech is clear. Cranial nerve II-VII grossly intact. Negative pronator drift. Reflexes 2+ and symmetric at biceps, triceps, knees, and ankles. Rapid alternating movement and fine finger movements intact. Sensation intact in upper extremities. Patient with decreased sensation in bilateral lower extremities at the level of the shins. Unable to differentiate sharp and dull. Patient does have sensation to groin. Strength 5 out of 5 in upper extremities. Strength 5 out of 5 in left lower extremity. Strength is 3 out of 5 in right lower extremity due to pain.  Skin: Skin is warm and dry. Capillary refill takes less than 2 seconds.  Vitals reviewed.    ED Treatments / Results  Labs (all labs ordered are listed, but only abnormal results are displayed) Labs Reviewed - No  data to display  EKG  EKG Interpretation None       Radiology Mr Lumbar Spine Wo Contrast  Result Date: 01/08/2016 CLINICAL DATA:  Bilateral leg pain. Two weeks of numbness of the top elevation. EXAM: MRI LUMBAR SPINE WITHOUT CONTRAST TECHNIQUE: Multiplanar, multisequence MR imaging of the lumbar spine was performed. No intravenous contrast was administered. COMPARISON:  CT lumbar spine 05/23/2014 FINDINGS: Segmentation:  Standard. Alignment: 4 mm anterolisthesis of L4 on L5 unchanged compared with 05/23/2014. Vertebrae:  No fracture, evidence of discitis, or bone lesion. Conus medullaris: Extends to the L2 level and appears normal. Paraspinal and other soft tissues: No focal paraspinal abnormality. Disc levels: Disc spaces: Degenerative disc disease with disc height loss and Modic endplate changes at L4-5. T12-L1: No significant disc bulge. No evidence of neural foraminal stenosis. No central canal stenosis. L1-L2: No significant disc bulge. No evidence of neural foraminal stenosis. No central canal stenosis. L2-L3: No significant disc bulge. No evidence of neural foraminal stenosis. No central canal stenosis. L3-L4: Mild broad-based disc bulge. Moderate bilateral facet arthropathy. Moderate spinal stenosis. No evidence of neural foraminal stenosis. L4-L5: Broad-based disc bulge with a  right paracentral/ foraminal disc protrusion. Severe bilateral facet arthropathy. Moderate spinal stenosis. Mild left foraminal stenosis. Severe right foraminal stenosis. L5-S1: Mild broad-based disc bulge with a small central disc protrusion. Mild bilateral facet arthropathy. Mild spinal stenosis. Mild bilateral foraminal stenosis. IMPRESSION: 1. At L4-5 there is a broad-based disc bulge with a right paracentral/ foraminal disc protrusion. Severe bilateral facet arthropathy. Moderate spinal stenosis. Mild left foraminal stenosis. Severe right foraminal stenosis. 2. At L5-S1 there is a mild broad-based disc bulge with a  small central disc protrusion. Mild bilateral facet arthropathy. Mild spinal stenosis. Mild bilateral foraminal stenosis. 3. At L3-4 there is a mild broad-based disc bulge. Moderate bilateral facet arthropathy. Moderate spinal stenosis. Electronically Signed   By: Elige KoHetal  Patel   On: 01/08/2016 09:34    Procedures Procedures (including critical care time)  Medications Ordered in ED Medications  morphine 4 MG/ML injection 4 mg (4 mg Intravenous Given 01/08/16 0843)  morphine 4 MG/ML injection 4 mg (4 mg Intravenous Given 01/08/16 1117)     Initial Impression / Assessment and Plan / ED Course  I have reviewed the triage vital signs and the nursing notes.  Pertinent labs & imaging results that were available during my care of the patient were reviewed by me and considered in my medical decision making (see chart for details).  Clinical Course   Patient with back pain that is chronic with lower extremity weakness and numbness.  MRI of lumbar was ordered. MRI showed bulging disc and spinal stenosis, but talked with Dr. Ollen BowlHarkins who is patients neurosurgeon who states this is baseline for patient. He has right sided lower extremity weakness at baseline and gets flares. He recs tapered steroid pack and follow up outpatient.  Patient can walk but states is painful.  No loss of bowel or bladder control.  No concern for cauda equina.  No fever, night sweats, weight loss, h/o cancer, IVDU.  RICE protocol and patient encouraged to continue taking is prescribed pain meds. He will follow up with Dr. Ollen BowlHarkins in the office. Pt is hemodynamically stable, in NAD, & able to ambulate in the ED. Pain has been managed & has no complaints prior to dc. Pt is comfortable with above plan and is stable for discharge at this time. All questions were answered prior to disposition. Strict return precautions for f/u to the ED were discussed. Patient discussed with Dr. Criss AlvineGoldston who is agreeable to the above plan.    Final  Clinical Impressions(s) / ED Diagnoses   Final diagnoses:  Chronic bilateral low back pain with bilateral sciatica    New Prescriptions New Prescriptions   No medications on file     Rise MuKenneth T Leaphart, PA-C 01/09/16 1125    Pricilla LovelessScott Goldston, MD 01/14/16 2204

## 2016-02-05 ENCOUNTER — Other Ambulatory Visit: Payer: Self-pay | Admitting: Neurological Surgery

## 2016-02-22 ENCOUNTER — Ambulatory Visit (HOSPITAL_COMMUNITY)
Admission: RE | Admit: 2016-02-22 | Discharge: 2016-02-22 | Disposition: A | Discharge status: Home / Self Care | Payer: Medicare Other | Source: Ambulatory Visit | Attending: Neurological Surgery | Admitting: Neurological Surgery

## 2016-02-22 ENCOUNTER — Encounter (HOSPITAL_COMMUNITY): Payer: Self-pay

## 2016-02-22 ENCOUNTER — Encounter (HOSPITAL_COMMUNITY)
Admission: RE | Admit: 2016-02-22 | Discharge: 2016-02-22 | Disposition: A | Discharge status: Home / Self Care | Payer: Medicare Other | Source: Ambulatory Visit | Attending: Neurological Surgery | Admitting: Neurological Surgery

## 2016-02-22 DIAGNOSIS — M4310 Spondylolisthesis, site unspecified: Secondary | ICD-10-CM | POA: Diagnosis present

## 2016-02-22 DIAGNOSIS — Z01818 Encounter for other preprocedural examination: Secondary | ICD-10-CM | POA: Insufficient documentation

## 2016-02-22 DIAGNOSIS — Z01810 Encounter for preprocedural cardiovascular examination: Secondary | ICD-10-CM | POA: Insufficient documentation

## 2016-02-22 DIAGNOSIS — Z01812 Encounter for preprocedural laboratory examination: Secondary | ICD-10-CM | POA: Diagnosis present

## 2016-02-22 HISTORY — DX: Gastro-esophageal reflux disease without esophagitis: K21.9

## 2016-02-22 HISTORY — DX: Unspecified osteoarthritis, unspecified site: M19.90

## 2016-02-22 LAB — CBC WITH DIFFERENTIAL/PLATELET
Basophils Absolute: 0.1 10*3/uL (ref 0.0–0.1)
Basophils Relative: 1 %
EOS ABS: 0.1 10*3/uL (ref 0.0–0.7)
Eosinophils Relative: 2 %
HCT: 42.7 % (ref 39.0–52.0)
HEMOGLOBIN: 14.4 g/dL (ref 13.0–17.0)
LYMPHS ABS: 3 10*3/uL (ref 0.7–4.0)
LYMPHS PCT: 39 %
MCH: 30.4 pg (ref 26.0–34.0)
MCHC: 33.7 g/dL (ref 30.0–36.0)
MCV: 90.1 fL (ref 78.0–100.0)
MONOS PCT: 5 %
Monocytes Absolute: 0.4 10*3/uL (ref 0.1–1.0)
NEUTROS PCT: 53 %
Neutro Abs: 4 10*3/uL (ref 1.7–7.7)
Platelets: 233 10*3/uL (ref 150–400)
RBC: 4.74 MIL/uL (ref 4.22–5.81)
RDW: 13.4 % (ref 11.5–15.5)
WBC: 7.6 10*3/uL (ref 4.0–10.5)

## 2016-02-22 LAB — TYPE AND SCREEN
ABO/RH(D): O POS
Antibody Screen: NEGATIVE

## 2016-02-22 LAB — BASIC METABOLIC PANEL
Anion gap: 8 (ref 5–15)
BUN: 11 mg/dL (ref 6–20)
CHLORIDE: 104 mmol/L (ref 101–111)
CO2: 25 mmol/L (ref 22–32)
CREATININE: 0.86 mg/dL (ref 0.61–1.24)
Calcium: 9.2 mg/dL (ref 8.9–10.3)
GFR calc Af Amer: >60 mL/min (ref >60)
GFR calc non Af Amer: >60 mL/min (ref >60)
Glucose, Bld: 116 mg/dL — ABNORMAL HIGH (ref 65–99)
Potassium: 3.8 mmol/L (ref 3.5–5.1)
Sodium: 137 mmol/L (ref 135–145)

## 2016-02-22 LAB — ABO/RH: ABO/RH(D): O POS

## 2016-02-22 LAB — PROTIME-INR
INR: 0.96
PROTHROMBIN TIME: 12.8 s (ref 11.4–15.2)

## 2016-02-22 LAB — SURGICAL PCR SCREEN
MRSA, PCR: NEGATIVE
STAPHYLOCOCCUS AUREUS: NEGATIVE

## 2016-02-22 NOTE — Progress Notes (Signed)
PCP - Mirna MiresGerald Hill  Cardiologist - denies  Chest x-ray - 02/22/16 EKG - 02/22/16 Stress Test -denies  ECHO - denies Cardiac Cath - denies  Patient has no cardiac history     Patient denies shortness of breath, fever, cough and chest pain at PAT appointment

## 2016-02-22 NOTE — Pre-Procedure Instructions (Signed)
Joanne Charsony Lehew  02/22/2016      [facility] [place]- , [place] - [address] [place] [id number] Phone: [phone] Fax: [phone]    Your procedure is scheduled on January 10  [facility] Admitting at [facility] A.M.  Call this number if you have problems the morning of surgery:  [phone]   Remember:  Do not eat food or drink liquids after midnight.   Take these medicines the morning of surgery with A SIP OF WATER acetaminophen (TYLENOL), gabapentin (NEURONTIN), HYDROcodone-acetaminophen (NORCO/VICODIN)  7 days prior to surgery STOP taking any Aspirin, Aleve, Naproxen, Ibuprofen, Motrin, Advil, Goody's, BC's, all herbal medications, fish oil, and all vitamins    Do not wear jewelry.  Do not wear lotions, powders, or cologne, or deoderant.  Men may shave face and neck.  Do not bring valuables to the hospital.  [facility] is not responsible for any belongings or valuables.  Contacts, dentures or bridgework may not be worn into surgery.  Leave your suitcase in the car.  After surgery it may be brought to your room.  For patients admitted to the hospital, discharge time will be determined by your treatment team.  Patients discharged the day of surgery will not be allowed to drive home.    Special instructions:   [facility]- Preparing For Surgery  Before surgery, you can play an important role. Because skin is not sterile, your skin needs to be as free of germs as possible. You can reduce the number of germs on your skin by washing with CHG (chlorahexidine gluconate) Soap before surgery.  CHG is an antiseptic cleaner which kills germs and bonds with the skin to continue killing germs even after washing.  Please do not use if you have an allergy to CHG or antibacterial soaps. If your skin becomes reddened/irritated stop using the CHG.  Do not shave (including legs and underarms) for at least 48 hours prior  to first CHG shower. It is OK to shave your face.  Please follow these instructions carefully.   1. Shower the NIGHT BEFORE SURGERY and the MORNING OF SURGERY with CHG.   2. If you chose to wash your hair, wash your hair first as usual with your normal shampoo.  3. After you shampoo, rinse your hair and body thoroughly to remove the shampoo.  4. Use CHG as you would any other liquid soap. You can apply CHG directly to the skin and wash gently with a scrungie or a clean washcloth.   5. Apply the CHG Soap to your body ONLY FROM THE NECK DOWN.  Do not use on open wounds or open sores. Avoid contact with your eyes, ears, mouth and genitals (private parts). Wash genitals (private parts) with your normal soap.  6. Wash thoroughly, paying special attention to the area where your surgery will be performed.  7. Thoroughly rinse your body with warm water from the neck down.  8. DO NOT shower/wash with your normal soap after using and rinsing off the CHG Soap.  9. Pat yourself dry with a CLEAN TOWEL.   10. Wear CLEAN PAJAMAS   11. Place CLEAN SHEETS on your bed the night of your first shower and DO NOT SLEEP WITH PETS.    Day of Surgery: Do not apply any deodorants/lotions. Please wear clean clothes to the hospital/surgery center.      Please read over the following fact sheets that you were given.

## 2016-02-28 ENCOUNTER — Inpatient Hospital Stay (HOSPITAL_COMMUNITY)
Admission: RE | Admit: 2016-02-28 | Discharge: 2016-02-29 | DRG: 460 | Disposition: A | Discharge status: Home / Self Care | Payer: Medicare Other | Source: Ambulatory Visit | Attending: Neurological Surgery | Admitting: Neurological Surgery

## 2016-02-28 ENCOUNTER — Inpatient Hospital Stay (HOSPITAL_COMMUNITY): Payer: Medicare Other | Admitting: Anesthesiology

## 2016-02-28 ENCOUNTER — Encounter (HOSPITAL_COMMUNITY)
Admission: RE | Disposition: A | Discharge status: Home / Self Care | Payer: Self-pay | Source: Ambulatory Visit | Attending: Neurological Surgery

## 2016-02-28 ENCOUNTER — Encounter (HOSPITAL_COMMUNITY): Payer: Self-pay | Admitting: Anesthesiology

## 2016-02-28 ENCOUNTER — Inpatient Hospital Stay (HOSPITAL_COMMUNITY): Payer: Medicare Other

## 2016-02-28 DIAGNOSIS — K219 Gastro-esophageal reflux disease without esophagitis: Secondary | ICD-10-CM | POA: Diagnosis present

## 2016-02-28 DIAGNOSIS — M5126 Other intervertebral disc displacement, lumbar region: Secondary | ICD-10-CM | POA: Diagnosis present

## 2016-02-28 DIAGNOSIS — M1990 Unspecified osteoarthritis, unspecified site: Secondary | ICD-10-CM | POA: Diagnosis present

## 2016-02-28 DIAGNOSIS — Z79899 Other long term (current) drug therapy: Secondary | ICD-10-CM

## 2016-02-28 DIAGNOSIS — M545 Low back pain: Secondary | ICD-10-CM | POA: Diagnosis present

## 2016-02-28 DIAGNOSIS — Z6830 Body mass index (BMI) 30.0-30.9, adult: Secondary | ICD-10-CM

## 2016-02-28 DIAGNOSIS — Z419 Encounter for procedure for purposes other than remedying health state, unspecified: Secondary | ICD-10-CM

## 2016-02-28 DIAGNOSIS — M4316 Spondylolisthesis, lumbar region: Secondary | ICD-10-CM | POA: Diagnosis present

## 2016-02-28 DIAGNOSIS — F17210 Nicotine dependence, cigarettes, uncomplicated: Secondary | ICD-10-CM | POA: Diagnosis present

## 2016-02-28 DIAGNOSIS — Z981 Arthrodesis status: Secondary | ICD-10-CM

## 2016-02-28 HISTORY — PX: MAXIMUM ACCESS (MAS)POSTERIOR LUMBAR INTERBODY FUSION (PLIF) 1 LEVEL: SHX6368

## 2016-02-28 SURGERY — FOR MAXIMUM ACCESS (MAS) POSTERIOR LUMBAR INTERBODY FUSION (PLIF) 1 LEVEL
Anesthesia: General | Site: Spine Lumbar

## 2016-02-28 MED ORDER — PROPOFOL 10 MG/ML IV BOLUS
INTRAVENOUS | Status: DC | PRN
  Administered 2016-02-28: 200 mg via INTRAVENOUS
  Administered 2016-02-28: 30 mg via INTRAVENOUS

## 2016-02-28 MED ORDER — PHENYLEPHRINE HCL 10 MG/ML IJ SOLN
INTRAMUSCULAR | Status: DC | PRN
  Administered 2016-02-28: 15 ug/min via INTRAVENOUS

## 2016-02-28 MED ORDER — ONDANSETRON HCL 4 MG/2ML IJ SOLN: 4.0000 mg | INTRAMUSCULAR | Status: DC | PRN

## 2016-02-28 MED ORDER — SODIUM CHLORIDE 0.9% FLUSH: 3.0000 mL | INTRAVENOUS | Status: DC | PRN

## 2016-02-28 MED ORDER — SUCCINYLCHOLINE CHLORIDE 200 MG/10ML IV SOSY
PREFILLED_SYRINGE | INTRAVENOUS | Status: AC
  Filled 2016-02-28: qty 10

## 2016-02-28 MED ORDER — MIDAZOLAM HCL 2 MG/2ML IJ SOLN
INTRAMUSCULAR | Status: AC
  Filled 2016-02-28: qty 2

## 2016-02-28 MED ORDER — METHOCARBAMOL 500 MG PO TABS
500.0000 mg | ORAL_TABLET | Freq: Four times a day (QID) | ORAL | Status: DC | PRN
  Administered 2016-02-28 – 2016-02-29 (×3): 500 mg via ORAL
  Filled 2016-02-28 (×3): qty 1

## 2016-02-28 MED ORDER — CEFAZOLIN SODIUM-DEXTROSE 2-4 GM/100ML-% IV SOLN
2.0000 g | INTRAVENOUS | Status: AC
  Administered 2016-02-28: 2 g via INTRAVENOUS
  Filled 2016-02-28: qty 100

## 2016-02-28 MED ORDER — SODIUM CHLORIDE 0.9 % IR SOLN
Status: DC | PRN
  Administered 2016-02-28: 12:00:00

## 2016-02-28 MED ORDER — MORPHINE SULFATE (PF) 4 MG/ML IV SOLN: 1.0000 mg | INTRAVENOUS | Status: DC | PRN

## 2016-02-28 MED ORDER — BUPIVACAINE HCL (PF) 0.25 % IJ SOLN
INTRAMUSCULAR | Status: DC | PRN
  Administered 2016-02-28: 4 mL

## 2016-02-28 MED ORDER — LIDOCAINE HCL (CARDIAC) 20 MG/ML IV SOLN
INTRAVENOUS | Status: DC | PRN
  Administered 2016-02-28: 80 mg via INTRAVENOUS

## 2016-02-28 MED ORDER — LACTATED RINGERS IV SOLN
INTRAVENOUS | Status: DC
  Administered 2016-02-28 (×2): via INTRAVENOUS

## 2016-02-28 MED ORDER — FENTANYL CITRATE (PF) 100 MCG/2ML IJ SOLN
INTRAMUSCULAR | Status: AC
  Filled 2016-02-28: qty 2

## 2016-02-28 MED ORDER — MENTHOL 3 MG MT LOZG: 1.0000 | LOZENGE | OROMUCOSAL | Status: DC | PRN

## 2016-02-28 MED ORDER — BUPIVACAINE HCL (PF) 0.25 % IJ SOLN
INTRAMUSCULAR | Status: AC
  Filled 2016-02-28: qty 30

## 2016-02-28 MED ORDER — MIDAZOLAM HCL 5 MG/5ML IJ SOLN
INTRAMUSCULAR | Status: DC | PRN
  Administered 2016-02-28 (×2): 1 mg via INTRAVENOUS
  Administered 2016-02-28: 2 mg via INTRAVENOUS

## 2016-02-28 MED ORDER — PROMETHAZINE HCL 25 MG/ML IJ SOLN: 6.2500 mg | INTRAMUSCULAR | Status: DC | PRN

## 2016-02-28 MED ORDER — CEFAZOLIN SODIUM-DEXTROSE 2-4 GM/100ML-% IV SOLN
2.0000 g | Freq: Three times a day (TID) | INTRAVENOUS | Status: AC
  Administered 2016-02-28 – 2016-02-29 (×2): 2 g via INTRAVENOUS
  Filled 2016-02-28 (×2): qty 100

## 2016-02-28 MED ORDER — MIDAZOLAM HCL 2 MG/2ML IJ SOLN: 0.5000 mg | Freq: Once | INTRAMUSCULAR | Status: DC | PRN

## 2016-02-28 MED ORDER — METHOCARBAMOL 1000 MG/10ML IJ SOLN
500.0000 mg | Freq: Four times a day (QID) | INTRAVENOUS | Status: DC | PRN
  Filled 2016-02-28: qty 5

## 2016-02-28 MED ORDER — CHLORHEXIDINE GLUCONATE CLOTH 2 % EX PADS: 6.0000 | MEDICATED_PAD | Freq: Once | CUTANEOUS | Status: DC

## 2016-02-28 MED ORDER — ACETAMINOPHEN 10 MG/ML IV SOLN
INTRAVENOUS | Status: DC | PRN
  Administered 2016-02-28: 1000 mg via INTRAVENOUS

## 2016-02-28 MED ORDER — THROMBIN 20000 UNITS EX SOLR
CUTANEOUS | Status: AC
  Filled 2016-02-28: qty 20000

## 2016-02-28 MED ORDER — THROMBIN 20000 UNITS EX SOLR
CUTANEOUS | Status: DC | PRN
  Administered 2016-02-28: 12:00:00 via TOPICAL

## 2016-02-28 MED ORDER — PROPOFOL 1000 MG/100ML IV EMUL
INTRAVENOUS | Status: AC
  Filled 2016-02-28: qty 100

## 2016-02-28 MED ORDER — HYDROMORPHONE HCL 1 MG/ML IJ SOLN
INTRAMUSCULAR | Status: AC
  Filled 2016-02-28: qty 0.5

## 2016-02-28 MED ORDER — DEXAMETHASONE SODIUM PHOSPHATE 10 MG/ML IJ SOLN
10.0000 mg | INTRAMUSCULAR | Status: AC
  Administered 2016-02-28: 10 mg via INTRAVENOUS
  Filled 2016-02-28: qty 1

## 2016-02-28 MED ORDER — SODIUM CHLORIDE 0.9% FLUSH: 3.0000 mL | Freq: Two times a day (BID) | INTRAVENOUS | Status: DC

## 2016-02-28 MED ORDER — THROMBIN 5000 UNITS EX SOLR
OROMUCOSAL | Status: DC | PRN
  Administered 2016-02-28: 12:00:00 via TOPICAL

## 2016-02-28 MED ORDER — SENNA 8.6 MG PO TABS
1.0000 | ORAL_TABLET | Freq: Two times a day (BID) | ORAL | Status: DC
Start: 2016-02-28 — End: 2016-02-29
  Administered 2016-02-28 – 2016-02-29 (×2): 8.6 mg via ORAL
  Filled 2016-02-28 (×2): qty 1

## 2016-02-28 MED ORDER — VANCOMYCIN HCL 1000 MG IV SOLR
INTRAVENOUS | Status: DC | PRN
  Administered 2016-02-28: 1000 mg via TOPICAL

## 2016-02-28 MED ORDER — FENTANYL CITRATE (PF) 100 MCG/2ML IJ SOLN
INTRAMUSCULAR | Status: DC | PRN
  Administered 2016-02-28 (×2): 50 ug via INTRAVENOUS
  Administered 2016-02-28: 100 ug via INTRAVENOUS
  Administered 2016-02-28 (×4): 50 ug via INTRAVENOUS

## 2016-02-28 MED ORDER — HYDROMORPHONE HCL 1 MG/ML IJ SOLN
0.2500 mg | INTRAMUSCULAR | Status: DC | PRN
  Administered 2016-02-28 (×2): 0.5 mg via INTRAVENOUS

## 2016-02-28 MED ORDER — 0.9 % SODIUM CHLORIDE (POUR BTL) OPTIME
TOPICAL | Status: DC | PRN
  Administered 2016-02-28: 1000 mL

## 2016-02-28 MED ORDER — MEPERIDINE HCL 25 MG/ML IJ SOLN: 6.2500 mg | INTRAMUSCULAR | Status: DC | PRN

## 2016-02-28 MED ORDER — POTASSIUM CHLORIDE IN NACL 20-0.9 MEQ/L-% IV SOLN: INTRAVENOUS | Status: DC

## 2016-02-28 MED ORDER — SUCCINYLCHOLINE CHLORIDE 20 MG/ML IJ SOLN
INTRAMUSCULAR | Status: DC | PRN
  Administered 2016-02-28: 140 mg via INTRAVENOUS

## 2016-02-28 MED ORDER — ONDANSETRON HCL 4 MG/2ML IJ SOLN
INTRAMUSCULAR | Status: DC | PRN
  Administered 2016-02-28: 4 mg via INTRAVENOUS

## 2016-02-28 MED ORDER — ACETAMINOPHEN 650 MG RE SUPP: 650.0000 mg | RECTAL | Status: DC | PRN

## 2016-02-28 MED ORDER — PHENOL 1.4 % MT LIQD: 1.0000 | OROMUCOSAL | Status: DC | PRN

## 2016-02-28 MED ORDER — OXYCODONE-ACETAMINOPHEN 5-325 MG PO TABS
1.0000 | ORAL_TABLET | ORAL | Status: DC | PRN
  Administered 2016-02-28 – 2016-02-29 (×5): 2 via ORAL
  Filled 2016-02-28 (×5): qty 2

## 2016-02-28 MED ORDER — ACETAMINOPHEN 10 MG/ML IV SOLN
INTRAVENOUS | Status: AC
  Filled 2016-02-28: qty 100

## 2016-02-28 MED ORDER — ACETAMINOPHEN 325 MG PO TABS: 650.0000 mg | ORAL_TABLET | ORAL | Status: DC | PRN

## 2016-02-28 MED ORDER — PROPOFOL 10 MG/ML IV BOLUS
INTRAVENOUS | Status: AC
  Filled 2016-02-28: qty 20

## 2016-02-28 MED ORDER — VANCOMYCIN HCL 1000 MG IV SOLR
INTRAVENOUS | Status: AC
  Filled 2016-02-28: qty 1000

## 2016-02-28 MED ORDER — THROMBIN 5000 UNITS EX SOLR
CUTANEOUS | Status: AC
  Filled 2016-02-28: qty 5000

## 2016-02-28 MED ORDER — LIDOCAINE 2% (20 MG/ML) 5 ML SYRINGE
INTRAMUSCULAR | Status: AC
  Filled 2016-02-28: qty 5

## 2016-02-28 MED ORDER — GABAPENTIN 300 MG PO CAPS
300.0000 mg | ORAL_CAPSULE | Freq: Three times a day (TID) | ORAL | Status: DC
  Administered 2016-02-28 – 2016-02-29 (×3): 300 mg via ORAL
  Filled 2016-02-28 (×3): qty 1

## 2016-02-28 MED ORDER — CELECOXIB 200 MG PO CAPS
200.0000 mg | ORAL_CAPSULE | Freq: Two times a day (BID) | ORAL | Status: DC
  Administered 2016-02-28 – 2016-02-29 (×2): 200 mg via ORAL
  Filled 2016-02-28 (×2): qty 1

## 2016-02-28 SURGICAL SUPPLY — 62 items
BAG DECANTER FOR FLEXI CONT (MISCELLANEOUS) ×2 IMPLANT
BASKET BONE COLLECTION (BASKET) ×2 IMPLANT
BENZOIN TINCTURE PRP APPL 2/3 (GAUZE/BANDAGES/DRESSINGS) ×2 IMPLANT
BIT DRILL PLIF MAS DISP 5.5MM (DRILL) ×1 IMPLANT
BONE MATRIX OSTEOCEL PRO SM (Bone Implant) ×4 IMPLANT
BUR MATCHSTICK NEURO 3.0 LAGG (BURR) ×2 IMPLANT
CAGE COROENT MP 8X23 (Cage) ×4 IMPLANT
CANISTER SUCT 3000ML PPV (MISCELLANEOUS) ×2 IMPLANT
CAP RELINE MOD TULIP RMM (Cap) ×4 IMPLANT
CARTRIDGE OIL MAESTRO DRILL (MISCELLANEOUS) ×1 IMPLANT
CLIP NEUROVISION LG (CLIP) ×2 IMPLANT
CONT SPEC 4OZ CLIKSEAL STRL BL (MISCELLANEOUS) ×2 IMPLANT
COVER BACK TABLE 60X90IN (DRAPES) ×2 IMPLANT
DIFFUSER DRILL AIR PNEUMATIC (MISCELLANEOUS) ×2 IMPLANT
DRAPE C-ARM 42X72 X-RAY (DRAPES) ×2 IMPLANT
DRAPE C-ARMOR (DRAPES) ×2 IMPLANT
DRAPE LAPAROTOMY 100X72X124 (DRAPES) ×2 IMPLANT
DRAPE POUCH INSTRU U-SHP 10X18 (DRAPES) ×2 IMPLANT
DRAPE SURG 17X23 STRL (DRAPES) ×2 IMPLANT
DRILL PLIF MAS DISP 5.5MM (DRILL) ×2
DRSG OPSITE POSTOP 4X8 (GAUZE/BANDAGES/DRESSINGS) ×2 IMPLANT
DURAPREP 26ML APPLICATOR (WOUND CARE) ×2 IMPLANT
ELECT REM PT RETURN 9FT ADLT (ELECTROSURGICAL) ×2
ELECTRODE REM PT RTRN 9FT ADLT (ELECTROSURGICAL) ×1 IMPLANT
EVACUATOR 1/8 PVC DRAIN (DRAIN) IMPLANT
GAUZE SPONGE 4X4 16PLY XRAY LF (GAUZE/BANDAGES/DRESSINGS) IMPLANT
GLOVE BIO SURGEON STRL SZ8 (GLOVE) ×4 IMPLANT
GLOVE BIOGEL PI IND STRL 7.5 (GLOVE) ×5 IMPLANT
GLOVE BIOGEL PI INDICATOR 7.5 (GLOVE) ×5
GLOVE ECLIPSE 6.5 STRL STRAW (GLOVE) ×2 IMPLANT
GLOVE ECLIPSE 7.5 STRL STRAW (GLOVE) ×4 IMPLANT
GLOVE SURG SS PI 7.0 STRL IVOR (GLOVE) ×6 IMPLANT
GOWN STRL REUS W/ TWL LRG LVL3 (GOWN DISPOSABLE) ×3 IMPLANT
GOWN STRL REUS W/ TWL XL LVL3 (GOWN DISPOSABLE) ×4 IMPLANT
GOWN STRL REUS W/TWL 2XL LVL3 (GOWN DISPOSABLE) IMPLANT
GOWN STRL REUS W/TWL LRG LVL3 (GOWN DISPOSABLE) ×3
GOWN STRL REUS W/TWL XL LVL3 (GOWN DISPOSABLE) ×4
HEMOSTAT POWDER KIT SURGIFOAM (HEMOSTASIS) ×2 IMPLANT
KIT BASIN OR (CUSTOM PROCEDURE TRAY) ×2 IMPLANT
KIT ROOM TURNOVER OR (KITS) ×2 IMPLANT
MILL MEDIUM DISP (BLADE) ×2 IMPLANT
MODULE NVM5 NEXT GEN EMG (NEEDLE) ×2 IMPLANT
NEEDLE HYPO 25X1 1.5 SAFETY (NEEDLE) ×2 IMPLANT
NS IRRIG 1000ML POUR BTL (IV SOLUTION) ×2 IMPLANT
OIL CARTRIDGE MAESTRO DRILL (MISCELLANEOUS) ×2
PACK LAMINECTOMY NEURO (CUSTOM PROCEDURE TRAY) ×2 IMPLANT
PAD ARMBOARD 7.5X6 YLW CONV (MISCELLANEOUS) ×6 IMPLANT
ROD RELINE COCR LORD 5X40MM (Rod) ×4 IMPLANT
SCREW LOCK RSS 4.5/5.0MM (Screw) ×8 IMPLANT
SCREW POLY RMM 5.5X40 4S (Screw) ×4 IMPLANT
SHANK RELINE O MOD 5.5X45MM (Screw) ×4 IMPLANT
SPONGE LAP 4X18 X RAY DECT (DISPOSABLE) IMPLANT
SPONGE SURGIFOAM ABS GEL 100 (HEMOSTASIS) ×2 IMPLANT
STRIP CLOSURE SKIN 1/2X4 (GAUZE/BANDAGES/DRESSINGS) ×2 IMPLANT
SUT VIC AB 0 CT1 18XCR BRD8 (SUTURE) ×1 IMPLANT
SUT VIC AB 0 CT1 8-18 (SUTURE) ×1
SUT VIC AB 2-0 CP2 18 (SUTURE) ×2 IMPLANT
SUT VIC AB 3-0 SH 8-18 (SUTURE) ×4 IMPLANT
TOWEL OR 17X24 6PK STRL BLUE (TOWEL DISPOSABLE) ×2 IMPLANT
TOWEL OR 17X26 10 PK STRL BLUE (TOWEL DISPOSABLE) ×2 IMPLANT
TRAY FOLEY W/METER SILVER 16FR (SET/KITS/TRAYS/PACK) ×2 IMPLANT
WATER STERILE IRR 1000ML POUR (IV SOLUTION) ×2 IMPLANT

## 2016-02-28 NOTE — Op Note (Signed)
02/28/2016  2:42 PM  PATIENT:  Timothy Massey  51 y.o. male  PRE-OPERATIVE DIAGNOSIS:  Post laminectomy spondylolisthesis with recurrent disc herniation, kyphosis, back and right leg pain  POST-OPERATIVE DIAGNOSIS:  Same  PROCEDURE:   1. Decompressive lumbar laminectomy L4-5 requiring more work than would be required for a simple exposure of the disk for PLIF in order to adequately decompress the neural elements and address the spinal stenosis 2. Posterior lumbar interbody fusion L4-5 using PEEK interbody cages packed with morcellized allograft and autograft 3. Posterior fixation L4-5 using cortical pedicle screws.    SURGEON:  Marikay Alar, MD  ASSISTANTS: Dr Franky Macho  ANESTHESIA:  General  EBL: 190 ml  Total I/O In: 1000 [I.V.:1000] Out: 840 [Urine:650; Blood:190]  BLOOD ADMINISTERED:none  DRAINS: none   INDICATION FOR PROCEDURE: This patient presented with severe back and right leg pain. He had undergone 2 previous microdiscectomies by another Careers adviser. Imaging revealed recurrent disc herniation with postlaminectomy spondylolisthesis. The patient tried a reasonable attempt at conservative medical measures without relief. I recommended decompression and instrumented fusion to address the stenosis as well as the segment once stability.  Patient understood the risks, benefits, and alternatives and potential outcomes and wished to proceed.  PROCEDURE DETAILS:  The patient was brought to the operating room. After induction of generalized endotracheal anesthesia the patient was rolled into the prone position on chest rolls and all pressure points were padded. The patient's lumbar region was cleaned and then prepped with DuraPrep and draped in the usual sterile fashion. Anesthesia was injected and then a dorsal midline incision was made and carried down to the lumbosacral fascia. The fascia was opened and the paraspinous musculature was taken down in a subperiosteal fashion to expose L4-5.  A self-retaining retractor was placed. Intraoperative fluoroscopy confirmed my level, and I started with placement of the L4 cortical pedicle screws. The pedicle screw entry zones were identified utilizing surface landmarks and  AP and lateral fluoroscopy. I scored the cortex with the high-speed drill and then used the hand drill and EMG monitoring to drill an upward and outward direction into the pedicle. I then tapped line to line, and the tap was also monitored. I then placed a 5.5 x 45 mm cortical pedicle screw into the pedicles of L4 bilaterally. I then turned my attention to the decompression and complete lumbar laminectomies, hemi- facetectomies, and foraminotomies were performed at L4-5. The patient had significant spinal stenosis and this required more work than would be required for a simple exposure of the disc for posterior lumbar interbody fusion. Much more generous decompression was undertaken in order to adequately decompress the neural elements and address the patient's leg pain. There was a large recurrent disc herniation L4-5 the right. Great care was taken to remove this. At no time did I see an unintended durotomy. The yellow ligament was removed to expose the underlying dura and nerve roots, and generous foraminotomies were performed to adequately decompress the neural elements. Both the exiting and traversing nerve roots were decompressed on both sides until a coronary dilator passed easily along the nerve roots. Once the decompression was complete, I turned my attention to the posterior lower lumbar interbody fusion. The epidural venous vasculature was coagulated and cut sharply. Disc space was incised and the initial discectomy was performed with pituitary rongeurs. The disc space was distracted with sequential distractors to a height of 8 mm. We then used a series of scrapers and shavers to prepare the endplates for fusion. The midline  was prepared with Epstein curettes. Once the complete  discectomy was finished, we packed an appropriate sized peek interbody cage with local autograft and morcellized allograft, gently retracted the nerve root, and tapped the cage into position at L4-5.  The midline between the cages was packed with morselized autograft and allograft. We then turned our attention to the placement of the lower pedicle screws. The pedicle screw entry zones were identified utilizing surface landmarks and fluoroscopy. I drilled into each pedicle utilizing the hand drill and EMG monitoring, and tapped each pedicle with the appropriate tap. We palpated with a ball probe to assure no break in the cortex. We then placed 5.5 x 40 mm into the pedicles bilaterally at L5.  We then placed lordotic rods into the multiaxial screw heads of the pedicle screws and locked these in position with the locking caps and anti-torque device. We then checked our construct with AP and lateral fluoroscopy. Irrigated with copious amounts of bacitracin-containing saline solution. Inspected the nerve roots once again to assure adequate decompression, lined to the dura with Gelfoam, and closed the muscle and the fascia with 0 Vicryl. Closed the subcutaneous tissues with 2-0 Vicryl and subcuticular tissues with 3-0 Vicryl. The skin was closed with benzoin and Steri-Strips. Dressing was then applied, the patient was awakened from general anesthesia and transported to the recovery room in stable condition. At the end of the procedure all sponge, needle and instrument counts were correct.   PLAN OF CARE: Admit to inpatient   PATIENT DISPOSITION:  PACU - hemodynamically stable.   Delay start of Pharmacological VTE agent (>24hrs) due to surgical blood loss or risk of bleeding:  yes

## 2016-02-28 NOTE — Anesthesia Preprocedure Evaluation (Addendum)
Anesthesia Evaluation  Patient identified by MRN, date of birth, ID band Patient awake    Reviewed: Allergy & Precautions, NPO status , Patient's Chart, lab work & pertinent test results  History of Anesthesia Complications Negative for: history of anesthetic complications  Airway Mallampati: I  TM Distance: >3 FB Neck ROM: Full    Dental  (+) Edentulous Upper, Edentulous Lower   Pulmonary Current Smoker,    breath sounds clear to auscultation       Cardiovascular (-) anginanegative cardio ROS   Rhythm:Regular Rate:Normal     Neuro/Psych Chronic back pain: narcotics    GI/Hepatic Neg liver ROS, GERD  Controlled,  Endo/Other  Morbid obesity  Renal/GU negative Renal ROS     Musculoskeletal   Abdominal (+) + obese,   Peds  Hematology negative hematology ROS (+)   Anesthesia Other Findings   Reproductive/Obstetrics                            Anesthesia Physical Anesthesia Plan  ASA: II  Anesthesia Plan: General   Post-op Pain Management:    Induction: Intravenous  Airway Management Planned: Oral ETT  Additional Equipment:   Intra-op Plan:   Post-operative Plan: Extubation in OR  Informed Consent: I have reviewed the patients History and Physical, chart, labs and discussed the procedure including the risks, benefits and alternatives for the proposed anesthesia with the patient or authorized representative who has indicated his/her understanding and acceptance.     Plan Discussed with: CRNA and Surgeon  Anesthesia Plan Comments: (Plan routine monitors, GETA)        Anesthesia Quick Evaluation

## 2016-02-28 NOTE — Anesthesia Postprocedure Evaluation (Signed)
Anesthesia Post Note  Patient: Timothy Massey  Procedure(s) Performed: Procedure(s) (LRB): LUMBAR FOUR-FIVE FOR MAXIMUM ACCESS (MAS) POSTERIOR LUMBAR INTERBODY FUSION (PLIF) 1 LEVEL masPLIF (N/A)  Patient location during evaluation: PACU Anesthesia Type: General Level of consciousness: awake and alert, patient cooperative and oriented Pain management: pain level controlled Vital Signs Assessment: post-procedure vital signs reviewed and stable Respiratory status: spontaneous breathing, nonlabored ventilation and respiratory function stable Cardiovascular status: blood pressure returned to baseline and stable Postop Assessment: no signs of nausea or vomiting Anesthetic complications: no       Last Vitals:  Vitals:   02/28/16 1621 02/28/16 1630  BP: 102/61   Pulse: 76 89  Resp: 13 14  Temp:  36.8 C    Last Pain:  Vitals:   02/28/16 1530  TempSrc:   PainSc: Asleep      LLE Sensation: Full sensation (02/28/16 1630)   RLE Sensation: Full sensation (02/28/16 1630)      Dawnielle Christiana,E. Lalo Tromp

## 2016-02-28 NOTE — Anesthesia Procedure Notes (Signed)
Procedure Name: Intubation Date/Time: 02/28/2016 10:54 AM Performed by: Kyung Rudd Pre-anesthesia Checklist: Patient identified, Emergency Drugs available, Suction available and Patient being monitored Patient Re-evaluated:Patient Re-evaluated prior to inductionOxygen Delivery Method: Circle system utilized Preoxygenation: Pre-oxygenation with 100% oxygen Intubation Type: IV induction Ventilation: Mask ventilation without difficulty Laryngoscope Size: Mac and 4 Grade View: Grade I Tube type: Oral Tube size: 7.5 mm Number of attempts: 1 Airway Equipment and Method: Stylet Placement Confirmation: ETT inserted through vocal cords under direct vision,  positive ETCO2 and breath sounds checked- equal and bilateral Secured at: 22 cm Dental Injury: Teeth and Oropharynx as per pre-operative assessment

## 2016-02-28 NOTE — Transfer of Care (Signed)
Immediate Anesthesia Transfer of Care Note  Patient: Timothy Massey  Procedure(s) Performed: Procedure(s): LUMBAR FOUR-FIVE FOR MAXIMUM ACCESS (MAS) POSTERIOR LUMBAR INTERBODY FUSION (PLIF) 1 LEVEL masPLIF (N/A)  Patient Location: PACU  Anesthesia Type:General  Level of Consciousness: awake, alert , oriented and patient cooperative  Airway & Oxygen Therapy: Patient Spontanous Breathing and Patient connected to nasal cannula oxygen  Post-op Assessment: Report given to RN and Post -op Vital signs reviewed and stable  Post vital signs: Reviewed and stable  Last Vitals:  Vitals:   02/28/16 0849 02/28/16 1506  BP: 125/81   Pulse: 73   Resp: 20   Temp: 37.1 C 36.3 C    Last Pain:  Vitals:   02/28/16 0853  TempSrc:   PainSc: 7       Patients Stated Pain Goal: 3 (02/28/16 0853)  Complications: No apparent anesthesia complications

## 2016-02-28 NOTE — H&P (Signed)
Subjective: Patient is a 51 y.o. male admitted for PLIF. Onset of symptoms was several months ago, rapidly worsening since that time.  The pain is rated severe, and is located at the across the lower back and radiates to RLE. The pain is described as aching and occurs all day. The symptoms have been progressive. Symptoms are exacerbated by exercise. 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 Arthritis   . GERD (gastroesophageal reflux disease)   . Hernia, inguinal, right   . Seizures (HCC)    one unknown reason    Past Surgical History:  Procedure Laterality Date  . BACK SURGERY    . HERNIA REPAIR     right groin  . KNEE SURGERY      Prior to Admission medications   Medication Sig Start Date End Date Taking? Authorizing Provider  gabapentin (NEURONTIN) 300 MG capsule Take 300 mg by mouth 3 (three) times daily.   Yes Historical Provider, MD  HYDROcodone-acetaminophen (NORCO/VICODIN) 5-325 MG tablet Take 1 tablet by mouth every 8 (eight) hours as needed for moderate pain.    Yes Historical Provider, MD  ibuprofen (ADVIL,MOTRIN) 800 MG tablet Take 800 mg by mouth every 8 (eight) hours as needed for mild pain.    Yes Historical Provider, MD  acetaminophen (TYLENOL) 500 MG tablet Take 500-1,000 mg by mouth every 6 (six) hours as needed for headache (DEPENDS ON PAIN IF TAKES 500-1000MG ).    Historical Provider, MD  baclofen (LIORESAL) 10 MG tablet Take 1 tablet (10 mg total) by mouth 3 (three) times daily. Prn muscle spasm Patient not taking: Reported on 02/13/2016 01/01/16   Hayden Rasmussen, NP  predniSONE (DELTASONE) 10 MG tablet Take 3 PO QAM x3days, 2 PO QAM x3days, 1 PO QAM x3days Patient not taking: Reported on 02/13/2016 01/08/16   Rise Mu, PA-C   Allergies  Allergen Reactions  . No Known Allergies     Social History  Substance Use Topics  . Smoking status: Current Every Day Smoker    Packs/day: 2.00    Types: Cigarettes  . Smokeless  tobacco: Never Used  . Alcohol use No    History reviewed. No pertinent family history.   Review of Systems  Positive ROS: neg  All other systems have been reviewed and were otherwise negative with the exception of those mentioned in the HPI and as above.  Objective: Vital signs in last 24 hours: Temp:  [98.7 F (37.1 C)] 98.7 F (37.1 C) (01/10 0849) Pulse Rate:  [73] 73 (01/10 0849) Resp:  [20] 20 (01/10 0849) BP: (125)/(81) 125/81 (01/10 0849) SpO2:  [99 %] 99 % (01/10 0849) Weight:  [101.2 kg (223 lb)] 101.2 kg (223 lb) (01/10 0849)  General Appearance: Alert, cooperative, no distress, appears stated age Head: Normocephalic, without obvious abnormality, atraumatic Eyes: PERRL, conjunctiva/corneas clear, EOM's intact    Neck: Supple, symmetrical, trachea midline Back: Symmetric, no curvature, ROM normal, no CVA tenderness Lungs:  respirations unlabored Heart: Regular rate and rhythm Abdomen: Soft, non-tender Extremities: Extremities normal, atraumatic, no cyanosis or edema Pulses: 2+ and symmetric all extremities Skin: Skin color, texture, turgor normal, no rashes or lesions  NEUROLOGIC:   Mental status: Alert and oriented x4,  no aphasia, good attention span, fund of knowledge, and memory Motor Exam - grossly normal Sensory Exam - grossly normal Reflexes: 1+ Coordination - grossly normal Gait - grossly normal Balance - grossly normal Cranial Nerves: I: smell Not tested  II: visual acuity  OS: nl    OD: nl  II: visual fields Full to confrontation  II: pupils Equal, round, reactive to light  III,VII: ptosis None  III,IV,VI: extraocular muscles  Full ROM  V: mastication Normal  V: facial light touch sensation  Normal  V,VII: corneal reflex  Present  VII: facial muscle function - upper  Normal  VII: facial muscle function - lower Normal  VIII: hearing Not tested  IX: soft palate elevation  Normal  IX,X: gag reflex Present  XI: trapezius strength  5/5  XI:  sternocleidomastoid strength 5/5  XI: neck flexion strength  5/5  XII: tongue strength  Normal    Data Review Lab Results  Component Value Date   WBC 7.6 02/22/2016   HGB 14.4 02/22/2016   HCT 42.7 02/22/2016   MCV 90.1 02/22/2016   PLT 233 02/22/2016   Lab Results  Component Value Date   NA 137 02/22/2016   K 3.8 02/22/2016   CL 104 02/22/2016   CO2 25 02/22/2016   BUN 11 02/22/2016   CREATININE 0.86 02/22/2016   GLUCOSE 116 (H) 02/22/2016   Lab Results  Component Value Date   INR 0.96 02/22/2016    Assessment/Plan: Patient admitted for PLIf L4-5. Patient has failed a reasonable attempt at conservative therapy.  I explained the condition and procedure to the patient and answered any questions.  Patient wishes to proceed with procedure as planned. Understands risks/ benefits and typical outcomes of procedure.   Galadriel Shroff S 02/28/2016 10:32 AM

## 2016-02-29 ENCOUNTER — Encounter (HOSPITAL_COMMUNITY): Payer: Self-pay | Admitting: Neurological Surgery

## 2016-02-29 MED ORDER — METHOCARBAMOL 500 MG PO TABS: 500.0000 mg | ORAL_TABLET | Freq: Four times a day (QID) | ORAL | 1 refills | Status: DC | PRN

## 2016-02-29 MED ORDER — OXYCODONE-ACETAMINOPHEN 5-325 MG PO TABS: 1.0000 | ORAL_TABLET | ORAL | 0 refills | Status: DC | PRN

## 2016-02-29 NOTE — Evaluation (Signed)
Physical Therapy Evaluation Patient Details Name: Timothy Massey MRN: [id number] DOB: [birth year] Today's Date: 02/29/2016   History of Present Illness  Pt is a 51 y/o male who presents s/p L4-L5 posterior lumbar fusion on 02/28/16.   Clinical Impression  This patient presents with acute pain and decreased functional independence following the above mentioned procedure. At the time of PT eval, pt was able to perform transfers and ambulation with min assist. Pt overall moving slow and pain was increased to 9/10. Was not able to complete stair training as brace was not delivered to room yet. Feel this pt could benefit from another PT session prior to d/c. This patient is appropriate for skilled PT interventions to address functional limitations, improve safety and independence with functional mobility, and return to PLOF.     Follow Up Recommendations Outpatient PT    Equipment Recommendations  3in1 (PT)    Recommendations for Other Services       Precautions / Restrictions Precautions Precautions: Fall;Back Precaution Booklet Issued: Yes (comment) Precaution Comments: Reviewed handout. Pt was cued for precautions during functional mobility.  Required Braces or Orthoses: Spinal Brace Spinal Brace: Lumbar corset;Applied in sitting position (Not present in room at time of eval) Restrictions Weight Bearing Restrictions: No      Mobility  Bed Mobility Overal bed mobility: Needs Assistance Bed Mobility: Sidelying to Sit   Sidelying to sit: Min assist       General bed mobility comments: Pt already in sidelying when PT arrived. Assist provided for trunk elevation to full sitting position. Heavy use of rails.   Transfers Overall transfer level: Needs assistance Equipment used: Straight cane Transfers: Sit to/from Stand Sit to Stand: Min assist         General transfer comment: Assist for power-up to full standing position.  Ambulation/Gait Ambulation/Gait assistance: Min  guard Ambulation Distance (Feet): 100 Feet Assistive device: Straight cane Gait Pattern/deviations: Step-through pattern;Decreased stride length;Trunk flexed Gait velocity: Decreased Gait velocity interpretation: Below normal speed for age/gender General Gait Details: Pt with antalgic gait pattern - likely from pain consistent with pre-op diagnosis. Pt anteriorly rotating L hip to advance LLE. When cued to correct, pt with increased pain on L side.   Stairs Stairs:  (deferred as brace not present during session. )          Wheelchair Mobility    Modified Rankin (Stroke Patients Only)       Balance Overall balance assessment: Needs assistance Sitting-balance support: Feet supported;No upper extremity supported Sitting balance-Leahy Scale: Good     Standing balance support: No upper extremity supported;During functional activity Standing balance-Leahy Scale: Fair                               Pertinent Vitals/Pain Pain Assessment: 0-10 Pain Score: 9  Pain Location: Back/L hip Pain Descriptors / Indicators: Operative site guarding;Discomfort Pain Intervention(s): Limited activity within patient's tolerance;Monitored during session;Repositioned    Home Living Family/patient expects to be discharged to:: Private residence Living Arrangements: Spouse/significant other Available Help at Discharge: Family;Neighbor;Available 24 hours/day Type of Home: House Home Access: Stairs to enter   [employer] of Steps: 3 Home Layout: One level Home Equipment: Cane - single point;Hand held shower head      Prior Function Level of Independence: Needs assistance   Gait / Transfers Assistance Needed: Using the [facility] all the time  ADL's / Homemaking Assistance Needed: Pt was sponge bathing at the sink  as he was afraid of falling in the shower.         Hand Dominance        Extremity/Trunk Assessment   Upper Extremity Assessment Upper Extremity  Assessment: Defer to OT evaluation    Lower Extremity Assessment Lower Extremity Assessment: Generalized weakness    Cervical / Trunk Assessment Cervical / Trunk Assessment: Normal  Communication   Communication: No difficulties  Cognition Arousal/Alertness: Awake/alert Behavior During Therapy: WFL for tasks assessed/performed Overall Cognitive Status: Within Functional Limits for tasks assessed                      General Comments      Exercises     Assessment/Plan    PT Assessment Patient needs continued PT services  PT Problem List Decreased strength;Decreased range of motion;Decreased activity tolerance;Decreased balance;Decreased mobility;Decreased knowledge of use of DME;Decreased safety awareness;Decreased knowledge of precautions;Pain          PT Treatment Interventions DME instruction;Gait training;Stair training;Functional mobility training;Therapeutic activities;Therapeutic exercise;Neuromuscular re-education;Patient/family education    PT Goals (Current goals can be found in the Care Plan section)  Acute Rehab PT Goals Patient Stated Goal: Home tomorrow PT Goal Formulation: With patient Time For Goal Achievement: 03/07/16 Potential to Achieve Goals: Good    Frequency Min 5X/week   Barriers to discharge        Co-evaluation               End of Session   Activity Tolerance: Patient limited by pain Patient left: in chair;with call bell/phone within reach Nurse Communication: Mobility status         Time: 0715-0748 PT Time Calculation (min) (ACUTE ONLY): 33 min   Charges:   PT Evaluation $PT Eval Moderate Complexity: 1 Procedure PT Treatments $Gait Training: 8-22 mins   PT G Codes:        Marylynn PearsonLaura D Aydyn Testerman 02/29/2016, 8:22 AM   Conni SlipperLaura Lettie Czarnecki, PT, DPT Acute Rehabilitation Services Pager: [phone]

## 2016-02-29 NOTE — Progress Notes (Signed)
Orthopedic Tech Progress Note Patient Details:  Timothy Massey Hearn 1965/10/06 [id number]  Patient ID: Timothy Massey Schank, male   DOB: 1965/10/06, 51 y.o.   MRN: [id number]   Nikki DomCrawford, Israella Hubert 02/29/2016, 9:21 AM Called in bio-tech brace order; spoke with Judeth CornfieldStephanie

## 2016-02-29 NOTE — Evaluation (Signed)
Occupational Therapy Evaluation Patient Details Name: Timothy Massey MRN: [id number] DOB: October 28, 1965 Today's Date: 02/29/2016    History of Present Illness Pt is a 51 y/o male who presents s/p L4-L5 posterior lumbar fusion on 02/28/16.    Clinical Impression   Pt reports he was modified independent with ADL PTA; was not getting in the shower due to fear of falling, only taking sponge baths. Currently pt min guard for ADL and functional mobility with the exception of min-mod assist for LB ADL. Began back, safety, and ADL education with pt. Feel pt would benefit from one more night hospital stay to reinforce back precautions and increase independence with functional mobility and ADL prior to return home. Pt planning to return home upon d/c with 24/7 supervision from family/neighbors. Pt would benefit from continued skilled OT to address established goals.    Follow Up Recommendations  No OT follow up;Supervision/Assistance - 24 hour (initially)    Equipment Recommendations  3 in 1 bedside commode    Recommendations for Other Services       Precautions / Restrictions Precautions Precautions: Fall;Back Precaution Booklet Issued: No Precaution Comments: Reviewed precautions with pt. Required Braces or Orthoses: Spinal Brace Spinal Brace: Lumbar corset;Applied in sitting position (not present in room at time of eval) Restrictions Weight Bearing Restrictions: No      Mobility Bed Mobility Overal bed mobility: Needs Assistance Bed Mobility: Sidelying to Sit   Sidelying to sit: Min assist       General bed mobility comments: Pt OOB in chair upon arrival  Transfers Overall transfer level: Needs assistance Equipment used: Straight cane Transfers: Sit to/from Stand Sit to Stand: Min guard         General transfer comment: Increased time and cues for hand placement.    Balance Overall balance assessment: Needs assistance Sitting-balance support: Feet supported;No upper  extremity supported Sitting balance-Leahy Scale: Good     Standing balance support: Single extremity supported Standing balance-Leahy Scale: Fair                              ADL Overall ADL's : Needs assistance/impaired Eating/Feeding: Set up;Sitting   Grooming: Min guard;Standing Grooming Details (indicate cue type and reason): Educated on use of 2 cups for oral care Upper Body Bathing: Set up;Supervision/ safety;Sitting   Lower Body Bathing: Minimal assistance;Sit to/from stand   Upper Body Dressing : Set up;Supervision/safety;Sitting Upper Body Dressing Details (indicate cue type and reason): without donning brace Lower Body Dressing: Moderate assistance;Sit to/from stand Lower Body Dressing Details (indicate cue type and reason): Pt unable to cross foot over opposite knee. Educated on compensatory strategies for LB ADL. Wife available to assist as needed. Toilet Transfer: Min guard;Ambulation;[facility])     Toileting - Clothing Manipulation Details (indicate cue type and reason): Educated on peri care technique without twisting and use of wet wipes.   Tub/Shower Transfer Details (indicate cue type and reason): Educated on use of 3 in 1 in tub as a seat; needs further education. Functional mobility during ADLs: Min guard;Cane General ADL Comments: Educated pt on maintaining back precautions during functional activities, keeping frequently used items at counter top height, frequent mobility thorughout the day upon return home.      Vision Vision Assessment?: No apparent visual deficits   Perception     Praxis      Pertinent Vitals/Pain Pain Assessment: Faces Pain Score: 9  Faces Pain Scale: Hurts even more  Pain Location: back Pain Descriptors / Indicators: Grimacing;Guarding;Sore;Operative site guarding Pain Intervention(s): Monitored during session;Premedicated before session     Hand Dominance     Extremity/Trunk Assessment Upper Extremity  Assessment Upper Extremity Assessment: Overall WFL for tasks assessed   Lower Extremity Assessment Lower Extremity Assessment: Defer to PT evaluation   Cervical / Trunk Assessment Cervical / Trunk Assessment: Other exceptions Cervical / Trunk Exceptions: s/p spinal sx   Communication Communication Communication: No difficulties   Cognition Arousal/Alertness: Awake/alert Behavior During Therapy: WFL for tasks assessed/performed Overall Cognitive Status: Within Functional Limits for tasks assessed                     General Comments       Exercises       Shoulder Instructions      Home Living Family/patient expects to be discharged to:: Private residence Living Arrangements: Spouse/significant other Available Help at Discharge: Family;Neighbor;Available 24 hours/day Type of Home: House Home Access: Stairs to enter Enter[employer] CorporationEntrance Stairs-Number of Steps: 3   Home Layout: One level     Bathroom Shower/Tub: Tub/shower unit Shower/tub characteristics: Engineer, building servicesCurtain Bathroom Toilet: Standard Bathroom Accessibility: Yes   Home Equipment: Cane - single point;Hand held shower head          Prior Functioning/Environment Level of Independence: Needs assistance  Gait / Transfers [facility] Ne[facility] ADL's / Homemaking Assistance Needed: Pt was sponge bathing at the sink as he was afraid of falling in the shower.             OT Problem List: Decreased strength;Decreased range of motion;Decreased activity tolerance;Impaired balance (sitting and/or standing);Decreased knowledge of use of DME or AE;Decreased knowledge of precautions;Pain   OT Treatment/Interventions: Self-care/ADL training;DME and/or AE instruction;Therapeutic activities;Patient/family education;Balance training    OT Goals(Current goals can be found in the care plan section) Acute Rehab OT Goals Patient Stated Goal: Home tomorrow OT Goal Formulation: With patient Time For Goal  Achievement: 03/14/16 Potential to Achieve Goals: Good ADL Goals Pt Will Perform Tub/Shower Transfer: Tub transfer;with supervision;ambulating;3 in 1 Additional ADL Goal #1: Pt will don/doff back brace with set up as precursor to ADL and functional mobility. Additional ADL Goal #2: Pt will independently verbally recall 3/3 back precautions and maintain throughout ADL.  OT Frequency: Min 2X/week   Barriers to D/C:            Co-evaluation              End of Session Equipment Utilized During Treatment: Other (comment) (cane) Nurse Communication: Mobility status;Other (comment) (needs 3 in 1 for home)  Activity Tolerance: Patient tolerated treatment well Patient left: in chair;with call bell/phone within reach   Time: 0753-0811 OT Time Calculation (min): 18 min Charges:  OT General Charges $OT Visit: 1 Procedure OT Evaluation $OT Eval Moderate Complexity: 1 Procedure G-Codes:     Gaye AlkenBailey A Fonnie Crookshanks M.S., OTR/L Pager: [phone]  02/29/2016, 9:07 AM

## 2016-02-29 NOTE — Discharge Instructions (Signed)

## 2016-02-29 NOTE — Discharge Summary (Signed)
Physician Discharge Summary  Patient ID: Timothy Massey MRN: [id number] DOB/AGE: [birth year] 51 y.o.  Admit date: 02/28/2016 Discharge date: 02/29/2016  Admission Diagnoses: spondylolisthesis with recurrent HNP L4-5    Discharge Diagnoses: same   Discharged Condition: good  Hospital Course: The patient was admitted on 02/28/2016 and taken to the operating room where the patient underwent PLIF. The patient tolerated the procedure well and was taken to the recovery room and then to the floor in stable condition. The hospital course was routine. There were no complications. The wound remained clean dry and intact. Pt had appropriate back soreness. No complaints of leg pain or new N/T/W. The patient remained afebrile with stable vital signs, and tolerated a regular diet. The patient continued to increase activities, and pain was well controlled with oral pain medications.   Consults: None  Significant Diagnostic Studies:  Results for orders placed or performed during the hospital encounter of 02/22/16  Surgical pcr screen  Result Value Ref Range   MRSA, PCR NEGATIVE NEGATIVE   Staphylococcus aureus NEGATIVE NEGATIVE  Basic metabolic panel  Result Value Ref Range   Sodium 137 135 - 145 mmol/L   Potassium 3.8 3.5 - 5.1 mmol/L   Chloride 104 101 - 111 mmol/L   CO2 25 22 - 32 mmol/L   Glucose, Bld 116 (H) 65 - 99 mg/dL   BUN 11 6 - 20 mg/dL   Creatinine, Ser 6.57 0.61 - 1.24 mg/dL   Calcium 9.2 8.9 - 84.6 mg/dL   GFR calc non Af Amer >60 >60 mL/min   GFR calc Af Amer >60 >60 mL/min   Anion gap 8 5 - 15  CBC WITH DIFFERENTIAL  Result Value Ref Range   WBC 7.6 4.0 - 10.5 K/uL   RBC 4.74 4.22 - 5.81 MIL/uL   Hemoglobin 14.4 13.0 - 17.0 g/dL   HCT 96.2 95.2 - 84.1 %   MCV 90.1 78.0 - 100.0 fL   MCH 30.4 26.0 - 34.0 pg   MCHC 33.7 30.0 - 36.0 g/dL   RDW 32.4 40.1 - 02.7 %   Platelets 233 150 - 400 K/uL   Neutrophils Relative % 53 %   Neutro Abs 4.0 1.7 - 7.7 K/uL   Lymphocytes  Relative 39 %   Lymphs Abs 3.0 0.7 - 4.0 K/uL   Monocytes Relative 5 %   Monocytes Absolute 0.4 0.1 - 1.0 K/uL   Eosinophils Relative 2 %   Eosinophils Absolute 0.1 0.0 - 0.7 K/uL   Basophils Relative 1 %   Basophils Absolute 0.1 0.0 - 0.1 K/uL  Protime-INR  Result Value Ref Range   Prothrombin Time 12.8 11.4 - 15.2 seconds   INR 0.96   Type and screen [facility]  Result Value Ref Range   ABO/RH(D) O POS    Antibody Screen NEG    Sample Expiration 03/07/2016    Extend sample reason NO TRANSFUSIONS OR PREGNANCY IN THE PAST 3 MONTHS   ABO/Rh  Result Value Ref Range   ABO/RH(D) O POS     Chest 2 View  Result Date: 02/22/2016 CLINICAL DATA:  Pre-admission study prior to lumbar fusion surgery. No chest complaints. Current smoker. EXAM: CHEST  2 VIEW COMPARISON:  None in PACs FINDINGS: The lungs are adequately inflated and clear. The heart and pulmonary vascularity are normal. The mediastinum is normal in width. There is no pleural effusion. The trachea is midline. The bony thorax is unremarkable. IMPRESSION: There is no active cardiopulmonary disease. Electronically Signed  By: Marcques Wrightsman  [place] M.D.   On: 02/22/2016 10:00   Dg Lumbar Spine 2-3 Views  Result Date: 02/28/2016 CLINICAL DATA:  Lumbar surgery. EXAM: LUMBAR SPINE - 2-3 VIEW; DG C-ARM 61-120 MIN COMPARISON:  MRI 01/08/2016 . FINDINGS: Lumbar vertebra numbered as per prior MRI. L4-L5 posterior and interbody fusion. Hardware intact. Stable mild anterolisthesis L4-L5. No acute bony abnormality. IMPRESSION: L4-L5 posterior interbody fusion. Electronically Signed   By: Maisie Fus  Register   On: 02/28/2016 14:22   Dg C-arm 1-60 Min  Result Date: 02/28/2016 CLINICAL DATA:  Lumbar surgery. EXAM: LUMBAR SPINE - 2-3 VIEW; DG C-ARM 61-120 MIN COMPARISON:  MRI 01/08/2016 . FINDINGS: Lumbar vertebra numbered as per prior MRI. L4-L5 posterior and interbody fusion. Hardware intact. Stable mild anterolisthesis L4-L5. No acute bony  abnormality. IMPRESSION: L4-L5 posterior interbody fusion. Electronically Signed   By: Maisie Fus  Register   On: 02/28/2016 14:22    Antibiotics:  Anti-infectives    Start     Dose/Rate Route Frequency Ordered Stop   02/28/16 1800  ceFAZolin (ANCEF) IVPB 2g/100 mL premix     2 g 200 mL/hr over 30 Minutes Intravenous Every 8 hours 02/28/16 1702 02/29/16 0134   02/28/16 1405  vancomycin (VANCOCIN) powder  Status:  Discontinued       As needed 02/28/16 1406 02/28/16 1500   02/28/16 1200  bacitracin 50,000 Units in sodium chloride irrigation 0.9 % 500 mL irrigation  Status:  Discontinued       As needed 02/28/16 1200 02/28/16 1500   02/28/16 1015  ceFAZolin (ANCEF) IVPB 2g/100 mL premix     2 g 200 mL/hr over 30 Minutes Intravenous On call to O.R. 02/28/16 0845 02/28/16 1125      Discharge Exam: Blood pressure (!) 89/59, pulse 85, temperature 98.4 F (36.9 C), temperature source Oral, resp. rate 18, weight 101.2 kg (223 lb), SpO2 93 %. Neurologic: Grossly normal Dressing dry  Discharge Medications:   Allergies as of 02/29/2016      Reactions   No Known Allergies       Medication List    STOP taking these medications   HYDROcodone-acetaminophen 5-325 MG tablet Commonly known as:  NORCO/VICODIN   ibuprofen 800 MG tablet Commonly known as:  ADVIL,MOTRIN     TAKE these medications   acetaminophen 500 MG tablet Commonly known as:  TYLENOL Take 500-1,000 mg by mouth every 6 (six) hours as needed for headache (DEPENDS ON PAIN IF TAKES 500-1000MG ).   baclofen 10 MG tablet Commonly known as:  LIORESAL Take 1 tablet (10 mg total) by mouth 3 (three) times daily. Prn muscle spasm   gabapentin 300 MG capsule Commonly known as:  NEURONTIN Take 300 mg by mouth 3 (three) times daily.   methocarbamol 500 MG tablet Commonly known as:  ROBAXIN Take 1 tablet (500 mg total) by mouth every 6 (six) hours as needed for muscle spasms.   oxyCODONE-acetaminophen 5-325 MG tablet Commonly known  as:  PERCOCET/ROXICET Take 1-2 tablets by mouth every 4 (four) hours as needed for moderate pain.   predniSONE 10 MG tablet Commonly known as:  DELTASONE Take 3 PO QAM x3days, 2 PO QAM x3days, 1 PO QAM x3days            Durable Medical Equipment        Start     Ordered   02/28/16 1703  DME Walker rolling  Once    Question:  Patient needs a walker to treat with the following condition  Answer:  S/P lumbar spinal fusion   02/28/16 1702   02/28/16 1703  DME 3 n 1  Once     02/28/16 1702      Disposition: home   Final Dx: PLIF L4-5  Discharge Instructions     Remove dressing in 72 hours    Complete by:  As directed    Call MD for:  difficulty breathing, headache or visual disturbances    Complete by:  As directed    Call MD for:  persistant nausea and vomiting    Complete by:  As directed    Call MD for:  redness, tenderness, or signs of infection (pain, swelling, redness, odor or green/yellow discharge around incision site)    Complete by:  As directed    Call MD for:  severe uncontrolled pain    Complete by:  As directed    Call MD for:  temperature >100.4    Complete by:  As directed    Diet - low sodium heart healthy    Complete by:  As directed    Increase activity slowly    Complete by:  As directed          Signed: Alechia Lezama S 02/29/2016, 9:10 AM

## 2016-02-29 NOTE — Progress Notes (Signed)
Pt and family given D/C instructions with Rx's, verbal understanding was provided. Pt's IV was removed prior to D/C. Pt's incision is clean and dry with no sign of infection. Pt received 3-n-1 from [facility] prior to D/C. Pt D/C'd home via wheelchair @ 1600 per MD order. Pt is stable @ D/C and has no other needs at this time. Rema FendtAshley Iverna Hammac, RN

## 2016-03-04 ENCOUNTER — Observation Stay (HOSPITAL_COMMUNITY)
Admission: AD | Admit: 2016-03-04 | Discharge: 2016-03-07 | Disposition: A | Discharge status: Home / Self Care | Payer: Medicare Other | Source: Ambulatory Visit | Attending: Neurological Surgery | Admitting: Neurological Surgery

## 2016-03-04 ENCOUNTER — Encounter (HOSPITAL_COMMUNITY): Payer: Self-pay | Admitting: Neurological Surgery

## 2016-03-04 DIAGNOSIS — M545 Low back pain: Secondary | ICD-10-CM | POA: Insufficient documentation

## 2016-03-04 DIAGNOSIS — M5459 Other low back pain: Secondary | ICD-10-CM | POA: Diagnosis present

## 2016-03-04 DIAGNOSIS — Z981 Arthrodesis status: Secondary | ICD-10-CM | POA: Insufficient documentation

## 2016-03-04 DIAGNOSIS — G8918 Other acute postprocedural pain: Principal | ICD-10-CM | POA: Insufficient documentation

## 2016-03-04 DIAGNOSIS — F17210 Nicotine dependence, cigarettes, uncomplicated: Secondary | ICD-10-CM | POA: Diagnosis not present

## 2016-03-04 MED ORDER — GABAPENTIN 300 MG PO CAPS
300.0000 mg | ORAL_CAPSULE | Freq: Three times a day (TID) | ORAL | Status: DC
  Administered 2016-03-04 – 2016-03-07 (×8): 300 mg via ORAL
  Filled 2016-03-04 (×8): qty 1

## 2016-03-04 MED ORDER — ACETAMINOPHEN 325 MG PO TABS: 650.0000 mg | ORAL_TABLET | ORAL | Status: DC | PRN

## 2016-03-04 MED ORDER — SENNA 8.6 MG PO TABS
1.0000 | ORAL_TABLET | Freq: Two times a day (BID) | ORAL | Status: DC
  Administered 2016-03-04 – 2016-03-07 (×6): 8.6 mg via ORAL
  Filled 2016-03-04 (×6): qty 1

## 2016-03-04 MED ORDER — DEXAMETHASONE SODIUM PHOSPHATE 4 MG/ML IJ SOLN
4.0000 mg | Freq: Four times a day (QID) | INTRAMUSCULAR | Status: DC
  Administered 2016-03-05 – 2016-03-06 (×3): 4 mg via INTRAVENOUS
  Filled 2016-03-04 (×3): qty 1

## 2016-03-04 MED ORDER — ONDANSETRON HCL 4 MG/2ML IJ SOLN
4.0000 mg | INTRAMUSCULAR | Status: DC | PRN
  Filled 2016-03-04: qty 2

## 2016-03-04 MED ORDER — ACETAMINOPHEN 650 MG RE SUPP: 650.0000 mg | RECTAL | Status: DC | PRN

## 2016-03-04 MED ORDER — CELECOXIB 200 MG PO CAPS
200.0000 mg | ORAL_CAPSULE | Freq: Two times a day (BID) | ORAL | Status: DC
  Administered 2016-03-04 – 2016-03-07 (×6): 200 mg via ORAL
  Filled 2016-03-04 (×6): qty 1

## 2016-03-04 MED ORDER — POTASSIUM CHLORIDE IN NACL 20-0.9 MEQ/L-% IV SOLN
INTRAVENOUS | Status: DC
  Administered 2016-03-05: 09:00:00 via INTRAVENOUS
  Filled 2016-03-04 (×3): qty 1000

## 2016-03-04 MED ORDER — OXYCODONE HCL 5 MG PO TABS
10.0000 mg | ORAL_TABLET | ORAL | Status: DC | PRN
  Administered 2016-03-04 – 2016-03-07 (×10): 10 mg via ORAL
  Filled 2016-03-04 (×10): qty 2

## 2016-03-04 MED ORDER — DEXAMETHASONE 4 MG PO TABS
4.0000 mg | ORAL_TABLET | Freq: Four times a day (QID) | ORAL | Status: DC
  Administered 2016-03-04 – 2016-03-07 (×9): 4 mg via ORAL
  Filled 2016-03-04 (×9): qty 1

## 2016-03-04 MED ORDER — MORPHINE SULFATE (PF) 2 MG/ML IV SOLN
1.0000 mg | INTRAVENOUS | Status: DC | PRN
  Administered 2016-03-04 – 2016-03-05 (×2): 2 mg via INTRAVENOUS
  Filled 2016-03-04 (×2): qty 1

## 2016-03-04 MED ORDER — CYCLOBENZAPRINE HCL 10 MG PO TABS
10.0000 mg | ORAL_TABLET | Freq: Three times a day (TID) | ORAL | Status: DC | PRN
  Administered 2016-03-04 – 2016-03-06 (×4): 10 mg via ORAL
  Filled 2016-03-04 (×4): qty 1

## 2016-03-04 NOTE — H&P (Signed)
Subjective: Patient is a 51 y.o. male who complains of severe postoperative back pain and left leg pain 5 days after plif L4 L5. Onset of symptoms was 4 days ago, rapidly worsening since that time.  Onset was not related to a fall. The pain is rated intense, unremitting, and is located at the across the lower back. The pain is described as aching and occurs all day. The symptoms denies been progressive. Symptoms are exacerbated by exercise. The patient has tried pain medications and muscle relaxants. Plain films in the office look good. No bowel or bladder changes  Past Medical History:  Diagnosis Date  . Arthritis   . GERD (gastroesophageal reflux disease)   . Hernia, inguinal, right   . Seizures (HCC)    one unknown reason    Past Surgical History:  Procedure Laterality Date  . BACK SURGERY    . HERNIA REPAIR     right groin  . KNEE SURGERY    . MAXIMUM ACCESS (MAS)POSTERIOR LUMBAR INTERBODY FUSION (PLIF) 1 LEVEL N/A 02/28/2016   Procedure: LUMBAR FOUR-FIVE FOR MAXIMUM ACCESS (MAS) POSTERIOR LUMBAR INTERBODY FUSION (PLIF) 1 LEVEL masPLIF;  Surgeon: Tia Alert, MD;  Location: [facility] OR;  Service: Neurosurgery;  Laterality: N/A;    Allergies  Allergen Reactions  . No Known Allergies     Social History  Substance Use Topics  . Smoking status: Current Every Day Smoker    Packs/day: 2.00    Types: Cigarettes  . Smokeless tobacco: Never Used  . Alcohol use No    History reviewed. No pertinent family history. Prior to Admission medications   Medication Sig Start Date End Date Taking? Authorizing Provider  acetaminophen (TYLENOL) 500 MG tablet Take 500-1,000 mg by mouth every 6 (six) hours as needed for headache (DEPENDS ON PAIN IF TAKES 500-1000MG ).    Historical Provider, MD  baclofen (LIORESAL) 10 MG tablet Take 1 tablet (10 mg total) by mouth 3 (three) times daily. Prn muscle spasm Patient not taking: Reported on 02/13/2016 01/01/16   Hayden Rasmussen, NP  gabapentin (NEURONTIN) 300 MG  capsule Take 300 mg by mouth 3 (three) times daily.    Historical Provider, MD  methocarbamol (ROBAXIN) 500 MG tablet Take 1 tablet (500 mg total) by mouth every 6 (six) hours as needed for muscle spasms. 02/29/16   Tia Alert, MD  oxyCODONE-acetaminophen (PERCOCET/ROXICET) 5-325 MG tablet Take 1-2 tablets by mouth every 4 (four) hours as needed for moderate pain. 02/29/16   Tia Alert, MD  predniSONE (DELTASONE) 10 MG tablet Take 3 PO QAM x3days, 2 PO QAM x3days, 1 PO QAM x3days Patient not taking: Reported on 02/13/2016 01/08/16   Rise Mu, PA-C     Review of Systems  Positive ROS: negative  All other systems have been reviewed and were otherwise negative with the exception of those mentioned in the HPI and as above.  Objective: Vital signs in last 24 hours:    General Appearance: Alert, cooperative, no distress, appears stated age Head: Normocephalic, without obvious abnormality, atraumatic Eyes: PERRL, conjunctiva/corneas clear, EOM's intact    Throat: Lips, mucosa, and tongue normal; teeth and gums normal Neck: Suppleenlargement/tenderness/nodules; no carotid bruit or JVD Back: Symmetric, no curvature, ROM normal, no CVA tenderness Lungs: respirations unlabored Heart: Regular rate and rhythm Abdomen: Soft Extremities: Extremities normal, atraumatic, no cyanosis or edema Pulses: 2+ and symmetric all extremities Skin: Skin color, texture, turgor normal, no rashes or lesions  NEUROLOGIC:   Mental status: alert and oriented,  no aphasia, good attention span, Fund of knowledge/ memory ok Motor Exam - grossly normal Sensory Exam - grossly normal Reflexes: 1+ Coordination - grossly normal Gait - not tested Balance -not tested Cranial Nerves: I: smell Not tested  II: visual acuity  OS: na    OD: na  II: visual fields Full to confrontation  II: pupils Equal, round, reactive to light  III,VII: ptosis None  III,IV,VI: extraocular muscles  Full ROM  V: mastication  Normal  V: facial light touch sensation  Normal  V,VII: corneal reflex  Present  VII: facial muscle function - upper  Normal  VII: facial muscle function - lower Normal  VIII: hearing Not tested  IX: soft palate elevation  Normal  IX,X: gag reflex Present  XI: trapezius strength  5/5  XI: sternocleidomastoid strength 5/5  XI: neck flexion strength  5/5  XII: tongue strength  Normal    Data Review Lab Results  Component Value Date   WBC 7.6 02/22/2016   HGB 14.4 02/22/2016   HCT 42.7 02/22/2016   MCV 90.1 02/22/2016   PLT 233 02/22/2016   Lab Results  Component Value Date   NA 137 02/22/2016   K 3.8 02/22/2016   CL 104 02/22/2016   CO2 25 02/22/2016   BUN 11 02/22/2016   CREATININE 0.86 02/22/2016   GLUCOSE 116 (H) 02/22/2016   Lab Results  Component Value Date   INR 0.96 02/22/2016    Assessment/Plan: Admit to floor for pain control, therapy, possible MRI, and IV Decadron. Could be postoperative radiculitis but could be epidural hematoma.  Exam difficult because of pain.  X-rays in the office rule out construct failure   Timothy Massey S 03/04/2016 5:10 PM

## 2016-03-05 DIAGNOSIS — G8918 Other acute postprocedural pain: Secondary | ICD-10-CM | POA: Diagnosis not present

## 2016-03-05 MED ORDER — DOCUSATE SODIUM 100 MG PO CAPS
100.0000 mg | ORAL_CAPSULE | Freq: Two times a day (BID) | ORAL | Status: DC
  Administered 2016-03-05 – 2016-03-07 (×4): 100 mg via ORAL
  Filled 2016-03-05 (×4): qty 1

## 2016-03-05 NOTE — Care Management Note (Signed)
Case Management Note  Patient Details  Name: Joanne Charsony Borner MRN: [id number] Date of Birth: Jan 02, 1966  Subjective/Objective:          Patient presented with back and left leg pain following a recent L4-5 PLIF.  Lives at home with spouse. CM will follow for discharge needs pending PT/OT evals and physician orders.           Action/Plan:   Expected Discharge Date:                  Expected Discharge Plan:     In-House Referral:     Discharge planning Services     Post Acute Care Choice:    Choice offered to:     DME Arranged:    DME Agency:     HH Arranged:    HH Agency:     Status of Service:     If discussed at [employer] Length of Stay Meetings, dates discussed:    Additional Comments:  Anda KraftRobarge, Aiysha Jillson C, RN 03/05/2016, 10:52 AM

## 2016-03-05 NOTE — Progress Notes (Signed)
Patient ID: Timothy Massey, male   DOB: 01/27/1966, 51 y.o.   MRN: [id number] Subjective: Patient reports he's better with meds  Objective: Vital signs in last 24 hours: Temp:  [97.6 F (36.4 C)-98.6 F (37 C)] 97.6 F (36.4 C) (01/16 0600) Pulse Rate:  [65-83] 65 (01/16 0600) Resp:  [16-20] 18 (01/16 0600) BP: (102-112)/(65-74) 112/74 (01/16 0600) SpO2:  [95 %-97 %] 95 % (01/16 0600)  Intake/Output from previous day: 01/15 0701 - 01/16 0700 In: -  Out: 800 [Urine:800] Intake/Output this shift: No intake/output data recorded.  Neurologic: Grossly normal  Lab Results: Lab Results  Component Value Date   WBC 7.6 02/22/2016   HGB 14.4 02/22/2016   HCT 42.7 02/22/2016   MCV 90.1 02/22/2016   PLT 233 02/22/2016   Lab Results  Component Value Date   INR 0.96 02/22/2016   BMET Lab Results  Component Value Date   NA 137 02/22/2016   K 3.8 02/22/2016   CL 104 02/22/2016   CO2 25 02/22/2016   GLUCOSE 116 (H) 02/22/2016   BUN 11 02/22/2016   CREATININE 0.86 02/22/2016   CALCIUM 9.2 02/22/2016    Studies/Results: No results found.  Assessment/Plan: Doing better, walked in halls, continue current care   LOS: 1 day    Evee Liska S 03/05/2016, 7:44 AM

## 2016-03-05 NOTE — Evaluation (Addendum)
Physical Therapy Evaluation Patient Details Name: Timothy Massey MRN: [id number] DOB: [birth year] Today's Date: 03/05/2016   History of Present Illness  Pt is a 51 y/o male who presents s/p L4-L5 posterior lumbar fusion on 02/28/16  re-admitted due to severe pain.   Clinical Impression  Patient presents with decreased independence with mobility due to pain after back surgery and limited assist at home.  Feel could return home with HHPT and continued assist of friends.  Would need RW as well.  Will continue skilled PT during acute stay.    Follow Up Recommendations Home health PT    Equipment Recommendations  Rolling walker with 5" wheels    Recommendations for Other Services       Precautions / Restrictions Precautions Precautions: Fall;Back Precaution Comments: Reviewed precautions with pt. Required Braces or Orthoses: Spinal Brace Spinal Brace: Lumbar corset;Applied in sitting position      Mobility  Bed Mobility   Bed Mobility: Rolling;Sidelying to Sit;Sit to Sidelying Rolling: Supervision Sidelying to sit: Supervision     Sit to sidelying: Supervision General bed mobility comments: cues for technique sit to side for back precautions, pt reports having difficulty because pain on L side had to roll to sleep on R side, then difficulty getting back rolled over to get out on L side due to bed against the wall  Transfers Overall transfer level: Needs assistance Equipment used: Rolling walker (2 wheeled) Transfers: Sit to/from Stand Sit to Stand: Supervision         General transfer comment: increased time, cues for technique  Ambulation/Gait Ambulation/Gait assistance: Supervision Ambulation Distance (Feet): 200 Feet Assistive device: Rolling walker (2 wheeled) Gait Pattern/deviations: Step-through pattern;Decreased stride length;Shuffle;Wide base of support;Trunk flexed     General Gait Details: picking up L foot more in steppage pattern, but no noted LOB; assist  for adjusting height of walker for increased upright posture  Stairs Stairs: Yes Stairs assistance: Min guard Stair Management: One rail Left;Step to pattern;Sideways;Forwards (two hands to rail on L versus forward with R HHA) Number of Stairs: 3 General stair comments: cues and demo for sideways technique versus forward with HHA to simulate cane  Wheelchair Mobility    Modified Rankin (Stroke Patients Only)       Balance Overall balance assessment: Needs assistance   Sitting balance-Leahy Scale: Good     Standing balance support: No upper extremity supported;During functional activity Standing balance-Leahy Scale: Fair Standing balance comment: standing to don pants no noted LOB with wide BOS                             Pertinent Vitals/Pain Pain Score: 8  Pain Location: back, L leg Pain Descriptors / Indicators: Aching;Sore;Tightness;Grimacing;Guarding Pain Intervention(s): Monitored during session;Repositioned    Home Living Family/patient expects to be discharged to:: Private residence Living Arrangements: Alone Available Help at Discharge: Friend(s);Available PRN/intermittently;Neighbor Type of Home: House Home Access: Stairs to enter Entrance Stairs-Rails: Acupuncturist of Steps: 3 Home Layout: One level Home Equipment: Cane - single point;Hand held shower head;Bedside commode      Prior Function Level of Independence: Needs assistance   Gait / Transfers Assistance Needed: Using the [facility] all the time, mobilizing with increased time esp for bed mobility (40 min to get from R sidelying to L side and come up to sit)  ADL's / Homemaking Assistance Needed: using milk jug for toileting (no BM since before surgery), was getting friend to help with  sponge bath        Hand Dominance        Extremity/Trunk Assessment        Lower Extremity Assessment Lower Extremity Assessment: RLE deficits/detail;LLE deficits/detail RLE Deficits /  Details: AAROM WFL, strength hip flexion 2+/5, knee extension 3+/5, ankle DF 4/5 LLE Deficits / Details: numbness on lower leg compared to R, AAROM WFL, strength hip flexion 2/5, knee extension 3+/5, ankle DF 4/5 LLE Sensation: decreased light touch       Communication   Communication: No difficulties  Cognition Arousal/Alertness: Awake/alert Behavior During Therapy: WFL for tasks assessed/performed Overall Cognitive Status: Within Functional Limits for tasks assessed                      General Comments      Exercises     Assessment/Plan    PT Assessment Patient needs continued PT services  PT Problem List Decreased strength;Decreased range of motion;Decreased activity tolerance;Decreased balance;Decreased mobility;Decreased knowledge of use of DME;Decreased safety awareness;Decreased knowledge of precautions;Pain          PT Treatment Interventions Gait training;DME instruction;Stair training;Balance training;Therapeutic activities;Therapeutic exercise;Functional mobility training;Patient/family education    PT Goals (Current goals can be found in the Care Plan section)  Acute Rehab PT Goals Patient Stated Goal: To get around easier PT Goal Formulation: With patient Time For Goal Achievement: 03/08/16 Potential to Achieve Goals: Good    Frequency Min 6X/week   Barriers to discharge        Co-evaluation               End of Session Equipment Utilized During Treatment: Back brace Activity Tolerance: Patient tolerated treatment well Patient left: in chair;with call bell/phone within reach      Functional Assessment Tool Used: Clinical Judgement Functional Limitation: Mobility: Walking and moving around Mobility: Walking and Moving Around Current Status ([id number](G8978): At least 20 percent but less than 40 percent impaired, limited or restricted Mobility: Walking and Moving Around Goal Status [id number]([id number]): At least 1 percent but less than 20 percent impaired,  limited or restricted    Time: 0950-1032 PT Time Calculation (min) (ACUTE ONLY): 42 min   Charges:   PT Evaluation $PT Eval Moderate Complexity: 1 Procedure PT Treatments $Gait Training: 8-22 mins $Therapeutic Activity: 8-22 mins   PT G Codes:   PT G-Codes **NOT FOR INPATIENT CLASS** Functional Assessment Tool Used: Clinical Judgement Functional Limitation: Mobility: Walking and moving around Mobility: Walking and Moving Around Current Status (L2440(G8978): At least 20 percent but less than 40 percent impaired, limited or restricted Mobility: Walking and Moving Around Goal Status [phone](G8979): At least 1 percent but less than 20 percent impaired, limited or restricted    Elray McgregorCynthia Brookelle Pellicane 03/05/2016, 12:04 PM  Sheran Lawlessyndi Carmin Alvidrez, PT [phone] 03/05/2016

## 2016-03-05 NOTE — Evaluation (Signed)
Occupational Therapy Evaluation Patient Details Name: Timothy Massey MRN: [id number] DOB: [birth year] Today's Date: 03/05/2016    History of Present Illness Pt is a 51 y/o male who presents s/p L4-L5 posterior lumbar fusion on 02/28/16  re-admitted due to severe pain.    Clinical Impression   Prior to previous admission, pt was independent with ADL. Since previous admission, pt has required assistance with bathing (sponge baths) and LB dressing from caregiver/friend. Pt currently requires min guard assist for LB ADL with adaptive equipment and min assist for tub transfers. Pt would benefit from continued OT services while admitted to improve independence with ADL and functional mobility in order to ensure safe D/C home. Educated pt and caregiver concerning use of AE for LB ADL, compensatory ADL strategies, and fall prevention strategies. OT will continue to follow acutely and no OT follow-up recommended post-acute D/C. Recommend initial 24 hour supervision/assistance.     Follow Up Recommendations  No OT follow up;Supervision/Assistance - 24 hour (Initially)    Equipment Recommendations  Tub/shower seat    Recommendations for Other Services       Precautions / Restrictions Precautions Precautions: Fall;Back Precaution Booklet Issued: No Precaution Comments: Reviewed precautions with pt. Required Braces or Orthoses: Spinal Brace Spinal Brace: Lumbar corset;Applied in sitting position Restrictions Weight Bearing Restrictions: No      Mobility Bed Mobility Overal bed mobility: Needs Assistance Bed Mobility: Rolling;Sidelying to Sit Rolling: Supervision Sidelying to sit: Supervision       General bed mobility comments: Increased time required. Reports difficulty with this at home due to position of bed against wall and bed sitting low to the ground. He reports having difficulty rolling without twisting PTA.  Transfers Overall transfer level: Needs assistance Equipment used:  Rolling walker (2 wheeled) Transfers: Sit to/from Stand Sit to Stand: Min guard         General transfer comment: increased time, cues for technique    Balance Overall balance assessment: Needs assistance Sitting-balance support: Feet supported;No upper extremity supported Sitting balance-Leahy Scale: Good     Standing balance support: No upper extremity supported;During functional activity Standing balance-Leahy Scale: Fair Standing balance comment: Able to stand statically without UE support.                            ADL Overall ADL's : Needs assistance/impaired Eating/Feeding: Set up;Sitting   Grooming: Min guard;Standing   Upper Body Bathing: Supervision/ safety;Sitting   Lower Body Bathing: Min guard;With adaptive equipment;Sit to/from stand   Upper Body Dressing : Supervision/safety;Sitting Upper Body Dressing Details (indicate cue type and reason): Including brace. Lower Body Dressing: Min guard;With adaptive equipment;Sit to/from stand   Toilet Transfer: Min guard;Ambulation;BSC   Toileting- Architect and Hygiene: Min guard;Sit to/from stand   Tub/ Shower Transfer: Tub transfer;Minimal assistance;Ambulation;Shower Dealer Details (indicate cue type and reason): Educated on use of shower chair for tub seat as well as 3-in-1. Pt and caregiver report that they would rather have shower seat than 3-in-1.  Functional mobility during ADLs: Min guard;Rolling walker General ADL Comments: Educated on benefits of mobility post-acute D/C rather than staying in bed. Educated on back precautions during ADL and use of compensatory strategies to maintain these during ADL. Educated on use of AE to improve independence with LB ADL. Pt requires verbal cues for maintenance of back precautions during ADL.     Vision Vision Assessment?: No apparent visual deficits   Perception  Praxis      Pertinent Vitals/Pain Pain  Assessment: 0-10 Pain Score: 7  Pain Location: back, L leg Pain Descriptors / Indicators: Aching;Sore;Tightness;Grimacing;Guarding Pain Intervention(s): Monitored during session;Repositioned     Hand Dominance Right   Extremity/Trunk Assessment Upper Extremity Assessment Upper Extremity Assessment: Overall WFL for tasks assessed   Lower Extremity Assessment Lower Extremity Assessment: Defer to PT evaluation       Communication Communication Communication: No difficulties   Cognition Arousal/Alertness: Awake/alert Behavior During Therapy: WFL for tasks assessed/performed Overall Cognitive Status: Within Functional Limits for tasks assessed                     General Comments       Exercises       Shoulder Instructions      Home Living Family/patient expects to be discharged to:: Private residence Living Arrangements: Alone Available Help at Discharge: Friend(s);Available PRN/intermittently;Neighbor Type of Home: House Home Access: Stairs to enter Secretary/administratorntrance Stairs-Number of Steps: 3 Entrance Stairs-Rails: Left Home Layout: One level     Bathroom Shower/Tub: Tub/shower unit Shower/tub characteristics: Curtain FirefighterBathroom Toilet: Standard Bathroom Accessibility: Yes   Home Equipment: Cane - single point;Hand held shower head;Bedside commode          Prior Functioning/Environment Level of Independence: Needs assistance  Gait / Transfers Assistance Needed: Using SPC at all times, requiring increased time for bed mobility this session. ADL's / Homemaking Assistance Needed: using milk jug for toileting (no BM since before surgery), was getting friend to help with sponge bath            OT Problem List: Decreased strength;Decreased range of motion;Decreased activity tolerance;Impaired balance (sitting and/or standing);Decreased knowledge of use of DME or AE;Decreased knowledge of precautions;Pain   OT Treatment/Interventions: Self-care/ADL training;DME  and/or AE instruction;Therapeutic activities;Patient/family education;Balance training;Energy conservation    OT Goals(Current goals can be found in the care plan section) Acute Rehab OT Goals Patient Stated Goal: To get back to normal OT Goal Formulation: With patient Time For Goal Achievement: 03/12/16 Potential to Achieve Goals: Good ADL Goals Pt Will Perform Lower Body Bathing: with modified independence;with adaptive equipment;sit to/from stand Pt Will Perform Lower Body Dressing: with modified independence;sit to/from stand;with adaptive equipment Pt Will Transfer to Toilet: with modified independence;ambulating;bedside commode (over toilet) Pt Will Perform Toileting - Clothing Manipulation and hygiene: with modified independence;sit to/from stand Pt Will Perform Tub/Shower Transfer: Tub transfer;with modified independence;shower seat;rolling walker Additional ADL Goal #1: Pt will identify and implement 3 fall prevention strategies into daily routine as a precursor for ADL independence and safety.  OT Frequency: Min 2X/week   Barriers to D/C:            Co-evaluation              End of Session Equipment Utilized During Treatment: Gait belt;Rolling walker Nurse Communication: Mobility status  Activity Tolerance: Patient tolerated treatment well Patient left: in chair;with call bell/phone within reach;with family/visitor present   Time: [id number] OT Time Calculation (min): 30 min Charges:  OT General Charges $OT Visit: 1 Procedure OT Evaluation $OT Eval Moderate Complexity: 1 Procedure OT Treatments $Self Care/Home Management : 8-22 mins G-Codes: OT G-codes **NOT FOR INPATIENT CLASS** Functional Assessment Tool Used: clinical judgement Functional Limitation: Self care Self Care Current Status (V4098(G8987): At least 1 percent but less than 20 percent impaired, limited or restricted Self Care Goal Status (J1914(G8988): At least 1 percent but less than 20 percent impaired,  limited or restricted  [address] OTR/L [phone] 03/05/2016, 5:22 PM

## 2016-03-05 NOTE — Care Management CC44 (Signed)
Condition Code 44 Documentation Completed  Patient Details  Name: Timothy Massey MRN: [id number] Date of Birth: August 03, 1965   Condition Code 44 given:  Yes Patient signature on Condition Code 44 notice:  Yes Documentation of 2 MD's agreement:  Yes Code 44 added to claim:  Yes    Kermit BaloKelli F Deliah Strehlow, RN 03/05/2016, 11:59 AM

## 2016-03-05 NOTE — Care Management Obs Status (Signed)
MEDICARE OBSERVATION STATUS NOTIFICATION   Patient Details  Name: Timothy Massey MRN: [id number] Date of Birth: 01/24/1966   Medicare Observation Status Notification Given:  Yes    Kermit BaloKelli F Addysen Louth, RN 03/05/2016, 11:59 AM

## 2016-03-06 DIAGNOSIS — G8918 Other acute postprocedural pain: Secondary | ICD-10-CM | POA: Diagnosis not present

## 2016-03-06 NOTE — Progress Notes (Signed)
Physical Therapy Treatment Patient Details Name: Timothy Massey MRN: [id number] DOB: 06-Jul-1965 Today's Date: 03/06/2016    History of Present Illness Pt is a 51 y/o male who presents s/p L4-L5 posterior lumbar fusion on 02/28/16  re-admitted due to severe pain.     PT Comments    Progressing with gait and stability with less pain noted.  Will continue to benefit from skilled PT until d/c.   Follow Up Recommendations  Home health PT     Equipment Recommendations  Rolling walker with 5" wheels    Recommendations for Other Services       Precautions / Restrictions Precautions Precautions: Fall;Back Spinal Brace: Lumbar corset;Applied in sitting position    Mobility  Bed Mobility               General bed mobility comments: up in chair, declined practicing bed mobility  Transfers Overall transfer level: Needs assistance Equipment used: Rolling walker (2 wheeled) Transfers: Sit to/from Stand Sit to Stand: Supervision         General transfer comment: Good hand placement with use of RW.  Ambulation/Gait Ambulation/Gait assistance: Supervision Ambulation Distance (Feet): 400 Feet Assistive device: Rolling walker (2 wheeled) Gait Pattern/deviations: Step-through pattern;Decreased stride length     General Gait Details: no LOB, gait slow but steady with RW   Stairs            Wheelchair Mobility    Modified Rankin (Stroke Patients Only)       Balance Overall balance assessment: Needs assistance   Sitting balance-Leahy Scale: Good     Standing balance support: No upper extremity supported;During functional activity Standing balance-Leahy Scale: Fair                      Cognition Arousal/Alertness: Awake/alert Behavior During Therapy: WFL for tasks assessed/performed Overall Cognitive Status: Within Functional Limits for tasks assessed                      Exercises      General Comments General comments (skin  integrity, edema, etc.): toileted unaided, except when pants down around his ankles needed help to lift them up      Pertinent Vitals/Pain Pain Score: 8  Pain Location: back, LLE Pain Descriptors / Indicators: Aching;Sore Pain Intervention(s): Monitored during session;Repositioned    Home Living                      Prior Function            PT Goals (current goals can now be found in the care plan section) Progress towards PT goals: Progressing toward goals    Frequency    Min 5X/week      PT Plan Current plan remains appropriate    Co-evaluation             End of Session Equipment Utilized During Treatment: Gait belt;Back brace Activity Tolerance: Patient tolerated treatment well Patient left: in chair;with call bell/phone within reach     Time: [id number] PT Time Calculation (min) (ACUTE ONLY): 24 min  Charges:  $Gait Training: 23-37 mins                    G CodesElray Mcgregor:      Mahki Spikes 03/06/2016, 5:06 PM  Sheran Lawlessyndi Geonna Lockyer, PT [phone] 03/06/2016

## 2016-03-06 NOTE — Progress Notes (Signed)
Occupational Therapy Treatment Patient Details Name: Timothy Massey MRN: [id number] DOB: [birth year] Today's Date: 03/06/2016    History of present illness Pt is a 51 y/o male who presents s/p L4-L5 posterior lumbar fusion on 02/28/16  re-admitted due to severe pain.    OT comments  Pt currently supervision overall for functional mobility and grooming activities in standing. Pt able to don back brace with set up. Able to recall 2/3 precautions; reviewed all precautions with pt. D/c plan remains appropriate. Will continue to follow acutely.    Follow Up Recommendations  No OT follow up;Supervision/Assistance - 24 hour    Equipment Recommendations  Tub/shower seat    Recommendations for Other Services      Precautions / Restrictions Precautions Precautions: Fall;Back Precaution Booklet Issued: No Precaution Comments: Pt able to verbally recall 2/3 precautions. Reviewed all precautions with pt. Required Braces or Orthoses: Spinal Brace Spinal Brace: Lumbar corset;Applied in sitting position Restrictions Weight Bearing Restrictions: No       Mobility Bed Mobility Overal bed mobility: Needs Assistance Bed Mobility: Rolling;Sidelying to Sit Rolling: Supervision Sidelying to sit: Supervision       General bed mobility comments: Increased time with use of bed rail. Cues for log roll technique.  Transfers Overall transfer level: Needs assistance Equipment used: Rolling walker (2 wheeled) Transfers: Sit to/from Stand Sit to Stand: Supervision         General transfer comment: Good hand placement with use of RW.    Balance Overall balance assessment: Needs assistance Sitting-balance support: Feet supported;No upper extremity supported Sitting balance-Leahy Scale: Good     Standing balance support: No upper extremity supported;During functional activity Standing balance-Leahy Scale: Fair                     ADL Overall ADL's : Needs  assistance/impaired Eating/Feeding: Set up;Sitting   Grooming: Supervision/safety;Standing;Wash/dry hands           Upper Body Dressing : Set up;Sitting Upper Body Dressing Details (indicate cue type and reason): to don brace     Toilet Transfer: Supervision/safety;Ambulation;RW Toilet Transfer Details (indicate cue type and reason): Simulated by sit to stand from EOB with functional mobility in room.         Functional mobility during ADLs: Supervision/safety;Rolling walker General ADL Comments: Reviewed back precautions in relation to ADL, log roll technique for bed mobility, frequent mobility, brace management and wear schedule.       Vision                     Perception     Praxis      Cognition   Behavior During Therapy: WFL for tasks assessed/performed Overall Cognitive Status: Within Functional Limits for tasks assessed                       Extremity/Trunk Assessment               Exercises     Shoulder Instructions       General Comments      Pertinent Vitals/ Pain       Pain Assessment: 0-10 Pain Score: 9  Pain Location: back, LLE Pain Descriptors / Indicators: Aching;Sharp;Shooting Pain Intervention(s): Monitored during session;Repositioned;RN gave pain meds during session  Home Living  Prior Functioning/Environment              Frequency  Min 2X/week        Progress Toward Goals  OT Goals(current goals can now be found in the care plan section)  Progress towards OT goals: Progressing toward goals  Acute Rehab OT Goals Patient Stated Goal: To get back to normal OT Goal Formulation: With patient  Plan Discharge plan remains appropriate    Co-evaluation                 End of Session Equipment Utilized During Treatment: Back brace;Rolling walker   Activity Tolerance Patient tolerated treatment well   Patient Left in chair;with call  bell/phone within reach   Nurse Communication Mobility status        Time: 1123-1140 OT Time Calculation (min): 17 min  Charges: OT General Charges $OT Visit: 1 Procedure OT Treatments $Self Care/Home Management : 8-22 mins  Gaye AlkenBailey A Mavryk Pino M.S., OTR/L Pager: [phone]  03/06/2016, 11:47 AM

## 2016-03-06 NOTE — Care Management Note (Signed)
Case Management Note  Patient Details  Name: Timothy Massey MRN: [id number] Date of Birth: 06-Feb-1966  Subjective/Objective:                    Action/Plan: Plan when ready for discharge is home with [facility] services. CM met with the patient and his friend and provided a list of [place] agencies. They selected [facility]. Hoyle Sauer with [place] notified and accepted the referral. Pt has walker for home use.   Expected Discharge Date:                  Expected Discharge Plan:  [place]  In-House Referral:     Discharge planning Services  CM Consult  Post Acute Care Choice:  Durable Medical Equipment, Home Health Choice offered to:  Patient  DME Arranged:  3-N-1, Walker rolling DME Agency:  [place]:  PT [place]:  [place]  Status of Service:  Completed, signed off  If discussed at [place] of Stay Meetings, dates discussed:    Additional Comments:  Pollie Friar, RN 03/06/2016, 10:55 AM

## 2016-03-06 NOTE — Progress Notes (Signed)
Patient ID: Timothy Massey, male   DOB: August 17, 1965, 51 y.o.   MRN: [id number] Pt doing much better, still some L lateral knee aching, no weakness or NT, good strength throughout. No way to d/c today because of weather. He has no safe way home

## 2016-03-07 DIAGNOSIS — G8918 Other acute postprocedural pain: Secondary | ICD-10-CM | POA: Diagnosis not present

## 2016-03-07 MED ORDER — OXYCODONE HCL 10 MG PO TABS: 10.0000 mg | ORAL_TABLET | ORAL | 0 refills | Status: DC | PRN

## 2016-03-07 NOTE — Progress Notes (Signed)
PT Cancellation Note  Patient Details Name: Timothy Massey MRN: [id number] DOB: 15-Sep-1965   Cancelled Treatment:    Reason Eval/Treat Not Completed: Other (comment); patient ready for d/c.  Reviewed equipment and precautions.  Deferred tx as ride waiting and ready for transport out.    Elray McgregorCynthia Wynn 03/07/2016, 1:36 PM  Sheran Lawlessyndi Wynn, [place]PT [phone] 03/07/2016

## 2016-03-07 NOTE — Progress Notes (Signed)
Discharge instructions given. Pt verbalized understanding and all questions were answered.  

## 2016-03-07 NOTE — Care Management Note (Signed)
Case Management Note  Patient Details  Name: Timothy Massey MRN: [id number] Date of Birth: 15-Jul-1965  Subjective/Objective:                    Action/Plan: Patient discharging home today. [facility] Pt has walker in room. Pt requesting hospital bed for home. Dr Yetta BarreJones ordered. Carollee HerterShannon with [facility] D[facility] notified. Pt states he has transportation home.  Expected Discharge Date:  03/07/16               Expected Discharge Plan:  Home w Home Health Services  In-House Referral:     Discharge planning Services  CM Consult  Post Acute Care Choice:  Durable Medical Equipment, Home Health Choice offered to:  Patient  DME Arranged:  3-N-1, Walker rolling, Hospital bed DME Agency:  [facility]  [facility]  Status of Service:  Completed, signed off  If discussed at Long Length of Stay Meetings, dates discussed:    Additional Comments:  Timothy BaloKelli F Raymundo Rout, RN 03/07/2016, 10:38 AM

## 2016-03-07 NOTE — Discharge Summary (Signed)
Physician Discharge Summary  Patient ID: Timothy Massey MRN: [id number] DOB/AGE: 10/31/1965 51 y.o.  Admit date: 03/04/2016 Discharge date: 03/07/2016  Admission Diagnoses: intractable back pain   Discharge Diagnoses: same   Discharged Condition: stable  Hospital Course: The patient was admitted on 03/04/2016 with intractable back pain after PLIF.  The hospital course was routine. There were no complications. The wound remained clean dry and intact. Pt had appropriate back soreness. No complaints of leg pain or new N/T/W. The patient remained afebrile with stable vital signs, and tolerated a regular diet. The patient continued to increase activities, and pain was well controlled with oral pain medications.   Consults: None  Significant Diagnostic Studies:  Results for orders placed or performed during the hospital encounter of 02/22/16  Surgical pcr screen  Result Value Ref Range   MRSA, PCR NEGATIVE NEGATIVE   Staphylococcus aureus NEGATIVE NEGATIVE  Basic metabolic panel  Result Value Ref Range   Sodium 137 135 - 145 mmol/L   Potassium 3.8 3.5 - 5.1 mmol/L   Chloride 104 101 - 111 mmol/L   CO2 25 22 - 32 mmol/L   Glucose, Bld 116 (H) 65 - 99 mg/dL   BUN 11 6 - 20 mg/dL   Creatinine, Ser 4.090.86 0.61 - 1.24 mg/dL   Calcium 9.2 8.9 - 81.110.3 mg/dL   GFR calc non Af Amer >60 >60 mL/min   GFR calc Af Amer >60 >60 mL/min   Anion gap 8 5 - 15  CBC WITH DIFFERENTIAL  Result Value Ref Range   WBC 7.6 4.0 - 10.5 K/uL   RBC 4.74 4.22 - 5.81 MIL/uL   Hemoglobin 14.4 13.0 - 17.0 g/dL   HCT 91.442.7 78.239.0 - 95.652.0 %   MCV 90.1 78.0 - 100.0 fL   MCH 30.4 26.0 - 34.0 pg   MCHC 33.7 30.0 - 36.0 g/dL   RDW 21.313.4 08.611.5 - 57.815.5 %   Platelets 233 150 - 400 K/uL   Neutrophils Relative % 53 %   Neutro Abs 4.0 1.7 - 7.7 K/uL   Lymphocytes Relative 39 %   Lymphs Abs 3.0 0.7 - 4.0 K/uL   Monocytes Relative 5 %   Monocytes Absolute 0.4 0.1 - 1.0 K/uL   Eosinophils Relative 2 %   Eosinophils Absolute  0.1 0.0 - 0.7 K/uL   Basophils Relative 1 %   Basophils Absolute 0.1 0.0 - 0.1 K/uL  Protime-INR  Result Value Ref Range   Prothrombin Time 12.8 11.4 - 15.2 seconds   INR 0.96   Type and screen [facility]  Result Value Ref Range   ABO/RH(D) O POS    Antibody Screen NEG    Sample Expiration 03/07/2016    Extend sample reason NO TRANSFUSIONS OR PREGNANCY IN THE PAST 3 MONTHS   ABO/Rh  Result Value Ref Range   ABO/RH(D) O POS     Chest 2 View  Result Date: 02/22/2016 CLINICAL DATA:  Pre-admission study prior to lumbar fusion surgery. No chest complaints. Current smoker. EXAM: CHEST  2 VIEW COMPARISON:  None in PACs FINDINGS: The lungs are adequately inflated and clear. The heart and pulmonary vascularity are normal. The mediastinum is normal in width. There is no pleural effusion. The trachea is midline. The bony thorax is unremarkable. IMPRESSION: There is no active cardiopulmonary disease. Electronically Signed   By: David  SwazilandJordan M.D.   On: 02/22/2016 10:00   Dg Lumbar Spine 2-3 Views  Result Date: 02/28/2016 CLINICAL DATA:  Lumbar surgery.  EXAM: LUMBAR SPINE - 2-3 VIEW; DG C-ARM 61-120 MIN COMPARISON:  MRI 01/08/2016 . FINDINGS: Lumbar vertebra numbered as per prior MRI. L4-L5 posterior and interbody fusion. Hardware intact. Stable mild anterolisthesis L4-L5. No acute bony abnormality. IMPRESSION: L4-L5 posterior interbody fusion. Electronically Signed   By: Maisie Fus  Register   On: 02/28/2016 14:22   Dg C-arm 1-60 Min  Result Date: 02/28/2016 CLINICAL DATA:  Lumbar surgery. EXAM: LUMBAR SPINE - 2-3 VIEW; DG C-ARM 61-120 MIN COMPARISON:  MRI 01/08/2016 . FINDINGS: Lumbar vertebra numbered as per prior MRI. L4-L5 posterior and interbody fusion. Hardware intact. Stable mild anterolisthesis L4-L5. No acute bony abnormality. IMPRESSION: L4-L5 posterior interbody fusion. Electronically Signed   By: Maisie Fus  Register   On: 02/28/2016 14:22    Antibiotics:  Anti-infectives     None      Discharge Exam: Blood pressure 111/75, pulse 70, temperature 98.3 F (36.8 C), temperature source Oral, resp. rate 19, SpO2 97 %. Neurologic: Grossly normal Dressing dry  Discharge Medications:   Allergies as of 03/07/2016      Reactions   No Known Allergies       Medication List    STOP taking these medications   oxyCODONE-acetaminophen 5-325 MG tablet Commonly known as:  PERCOCET/ROXICET     TAKE these medications   Oxycodone HCl 10 MG Tabs Take 1 tablet (10 mg total) by mouth every 4 (four) hours as needed for moderate pain.   tiZANidine 4 MG tablet Commonly known as:  ZANAFLEX Take 4 mg by mouth every 8 (eight) hours as needed for muscle spasms.            Durable Medical Equipment        Start     Ordered   03/07/16 1008  For home use only DME Dan Humphreys  [facility])  Once    Question:  Patient needs a walker to treat with the following condition  Answer:  S/P lumbar fusion   03/07/16 1007   03/07/16 1006  For home use only DME Hospital bed  Once    Question:  Bed type  Answer:  Semi-electric   03/07/16 1005   03/04/16 1803  DME Walker rolling  Once    Question:  Patient needs a walker to treat with the following condition  Answer:  S/P lumbar spinal fusion   03/04/16 1802   03/04/16 1803  DME 3 n 1  Once     03/04/16 1802      Disposition: home   Final Dx: back pain  Discharge Instructions    Call MD for:  difficulty breathing, headache or visual disturbances    Complete by:  As directed    Call MD for:  persistant nausea and vomiting    Complete by:  As directed    Call MD for:  redness, tenderness, or signs of infection (pain, swelling, redness, odor or green/yellow discharge around incision site)    Complete by:  As directed    Call MD for:  severe uncontrolled pain    Complete by:  As directed    Call MD for:  temperature >100.4    Complete by:  As directed    Diet - low sodium heart healthy    Complete by:  As directed    Increase  activity slowly    Complete by:  As directed          Signed: JONES,DAVID S 03/07/2016, 10:07 AM

## 2016-04-14 ENCOUNTER — Encounter (HOSPITAL_COMMUNITY): Payer: Self-pay

## 2016-04-14 ENCOUNTER — Ambulatory Visit (HOSPITAL_COMMUNITY)
Admission: EM | Admit: 2016-04-14 | Discharge: 2016-04-14 | Disposition: A | Discharge status: Home / Self Care | Payer: Medicare Other | Attending: Family Medicine | Admitting: Family Medicine

## 2016-04-14 DIAGNOSIS — K625 Hemorrhage of anus and rectum: Secondary | ICD-10-CM

## 2016-04-14 MED ORDER — OMEPRAZOLE 20 MG PO CPDR: 20.0000 mg | DELAYED_RELEASE_CAPSULE | Freq: Every day | ORAL | 0 refills | Status: DC

## 2016-04-14 NOTE — Discharge Instructions (Signed)
You need to see your gastroenterologist this week. These call the number listed and make that appointment.

## 2016-04-14 NOTE — ED Triage Notes (Signed)
[facility] of [facility] stools x2 months, pt states it stopped but started again 2 days ago.

## 2016-04-14 NOTE — ED Provider Notes (Signed)
[facility]    CSN: [id number] Arrival date & time: 04/14/16  1628     History   Chief Complaint Chief Complaint  Patient presents with  . Rectal Bleeding    HPI Timothy Massey is a 51 y.o. male.   This 51 year old man who presents with 2 months of rectal bleeding. He underwent lumbar surgery one month ago. He's had no abdominal pain and he's had no rectal pain. He describes of blood is both bright red and dark maroon. He's had a colonoscopy in the past.      Past Medical History:  Diagnosis Date  . Arthritis   . GERD (gastroesophageal reflux disease)   . Hernia, inguinal, right   . Seizures (HCC)    one unknown reason    Patient Active Problem List   Diagnosis Date Noted  . Intractable low back pain 03/04/2016  . S/P lumbar spinal fusion 02/28/2016    Past Surgical History:  Procedure Laterality Date  . BACK SURGERY    . HERNIA REPAIR     right groin  . KNEE SURGERY    . MAXIMUM ACCESS (MAS)POSTERIOR LUMBAR INTERBODY FUSION (PLIF) 1 LEVEL N/A 02/28/2016   Procedure: LUMBAR FOUR-FIVE FOR MAXIMUM ACCESS (MAS) POSTERIOR LUMBAR INTERBODY FUSION (PLIF) 1 LEVEL masPLIF;  Surgeon: Tia Alert, MD;  Location: [facility] OR;  Service: Neurosurgery;  Laterality: N/A;       Home Medications    Prior to Admission medications   Medication Sig Start Date End Date Taking? Authorizing Provider  oxyCODONE 10 MG TABS Take 1 tablet (10 mg total) by mouth every 4 (four) hours as needed for moderate pain. 03/07/16  Yes Tia Alert, MD  omeprazole (PRILOSEC) 20 MG capsule Take 1 capsule (20 mg total) by mouth daily. 04/14/16   Elvina Sidle, MD  tiZANidine (ZANAFLEX) 4 MG tablet Take 4 mg by mouth every 8 (eight) hours as needed for muscle spasms.    Historical Provider, MD    Family History History reviewed. No pertinent family history.  Social History Social History  Substance Use Topics  . Smoking status: Current Every Day Smoker    Packs/day: 2.00    Types:  Cigarettes  . Smokeless tobacco: Never Used  . Alcohol use No     Allergies   No known allergies   Review of Systems Review of Systems  Constitutional: Negative.   HENT: Negative.   Respiratory: Negative.   Cardiovascular: Negative.   Gastrointestinal: Positive for blood in stool. Negative for abdominal pain, diarrhea and rectal pain.  Genitourinary: Negative.   Neurological: Negative.      Physical Exam Triage Vital Signs ED Triage Vitals [04/14/16 1644]  Enc Vitals Group     BP 125/90     Pulse Rate 82     Resp 17     Temp 98.4 F (36.9 C)     Temp Source Oral     SpO2 97 %     Weight      Height      Head Circumference      Peak Flow      Pain Score      Pain Loc      Pain Edu?      Excl. in GC?    No data found.   Updated Vital Signs BP 125/90 (BP Location: Right Arm)   Pulse 82   Temp 98.4 F (36.9 C) (Oral)   Resp 17   SpO2 97%    Physical  Exam  Constitutional: He is oriented to person, place, and time. He appears well-developed and well-nourished.  HENT:  Head: Normocephalic and atraumatic.  Right Ear: External ear normal.  Left Ear: External ear normal.  Mouth/Throat: Oropharynx is clear and moist.  Eyes: Conjunctivae and EOM are normal. Pupils are equal, round, and reactive to light.  Neck: Normal range of motion. Neck supple.  Pulmonary/Chest: Effort normal.  Genitourinary:  Genitourinary Comments: Anus normal  Musculoskeletal: Normal range of motion.  Neurological: He is alert and oriented to person, place, and time.  Skin: Skin is warm and dry.  Nursing note and vitals reviewed.    UC Treatments / Results  Labs (all labs ordered are listed, but only abnormal results are displayed) Labs Reviewed - No data to display  EKG  EKG Interpretation None       Radiology No results found.  Procedures Procedures (including critical care time)  Medications Ordered in UC Medications - No data to display   Initial Impression  / Assessment and Plan / UC Course  I have reviewed the triage vital signs and the nursing notes.  Pertinent labs & imaging results that were available during my care of the patient were reviewed by me and considered in my medical decision making (see chart for details).      Final Clinical Impressions(s) / UC Diagnoses   Final diagnoses:  Rectal bleeding    New Prescriptions New Prescriptions   OMEPRAZOLE (PRILOSEC) 20 MG CAPSULE    Take 1 capsule (20 mg total) by mouth daily.  Follow-up this week with gastroenterology   Elvina SidleKurt Admir Candelas, MD 04/14/16 [phone]

## 2017-01-22 ENCOUNTER — Other Ambulatory Visit: Payer: Self-pay | Admitting: Neurological Surgery

## 2017-01-22 DIAGNOSIS — R202 Paresthesia of skin: Secondary | ICD-10-CM

## 2017-07-24 ENCOUNTER — Other Ambulatory Visit: Payer: Self-pay

## 2017-07-24 ENCOUNTER — Emergency Department (HOSPITAL_COMMUNITY)
Admission: EM | Admit: 2017-07-24 | Discharge: 2017-07-24 | Disposition: A | Discharge status: Home / Self Care | Payer: Medicare Other | Attending: Emergency Medicine | Admitting: Emergency Medicine

## 2017-07-24 ENCOUNTER — Encounter (HOSPITAL_COMMUNITY): Payer: Self-pay

## 2017-07-24 DIAGNOSIS — Y33XXXA Other specified events, undetermined intent, initial encounter: Secondary | ICD-10-CM | POA: Diagnosis not present

## 2017-07-24 DIAGNOSIS — F17210 Nicotine dependence, cigarettes, uncomplicated: Secondary | ICD-10-CM | POA: Diagnosis not present

## 2017-07-24 DIAGNOSIS — S81801A Unspecified open wound, right lower leg, initial encounter: Secondary | ICD-10-CM

## 2017-07-24 DIAGNOSIS — Y939 Activity, unspecified: Secondary | ICD-10-CM | POA: Diagnosis not present

## 2017-07-24 DIAGNOSIS — Y998 Other external cause status: Secondary | ICD-10-CM | POA: Insufficient documentation

## 2017-07-24 DIAGNOSIS — Z79899 Other long term (current) drug therapy: Secondary | ICD-10-CM | POA: Insufficient documentation

## 2017-07-24 DIAGNOSIS — Y929 Unspecified place or not applicable: Secondary | ICD-10-CM | POA: Diagnosis not present

## 2017-07-24 MED ORDER — CEPHALEXIN 500 MG PO CAPS: 500.0000 mg | ORAL_CAPSULE | Freq: Four times a day (QID) | ORAL | 0 refills | Status: DC

## 2017-07-24 MED ORDER — BACITRACIN ZINC 500 UNIT/GM EX OINT
TOPICAL_OINTMENT | Freq: Once | CUTANEOUS | Status: AC
  Administered 2017-07-24: 07:00:00 via TOPICAL

## 2017-07-24 NOTE — ED Triage Notes (Signed)
Pt has spot above his R ankle that he belivies is a tick and he tried to get off and cant, no swelling, small abrasion where he was "digging" to get it out

## 2017-07-24 NOTE — ED Notes (Signed)
ED Provider at bedside. 

## 2017-07-24 NOTE — ED Provider Notes (Signed)
[facility] EMERGENCY DEPARTMENT Provider Note   CSN: [id number] Arrival date & time: 07/24/17  0123     History   Chief Complaint Chief Complaint  Patient presents with  . Abscess    HPI Loren Sawaya is a 52 y.o. male past medical history of arthritis, GERD who presents for evaluation of small wound noted to medial aspect of the distal right lower extremity.  Patient reports that last night, he noticed a small wound noted to the medial aspect of his right lower leg.  Patient unsure of how the wound got there.  He does not recall being bitten by anything or hitting the leg against anything.  Patient states he was worried because he thought a tick was on the leg and was concerned about a small area in the middle.  Wife states she brought patient in because she was worried it went deeper.  Patient states that the area is sore and hurts more with walking, though he has been able to ambulate.  He denies any fevers, numbness/weakness.  The history is provided by the patient.    Past Medical History:  Diagnosis Date  . Arthritis   . GERD (gastroesophageal reflux disease)   . Hernia, inguinal, right   . Seizures (HCC)    one unknown reason    Patient Active Problem List   Diagnosis Date Noted  . Intractable low back pain 03/04/2016  . S/P lumbar spinal fusion 02/28/2016    Past Surgical History:  Procedure Laterality Date  . BACK SURGERY    . HERNIA REPAIR     right groin  . KNEE SURGERY    . MAXIMUM ACCESS (MAS)POSTERIOR LUMBAR INTERBODY FUSION (PLIF) 1 LEVEL N/A 02/28/2016   Procedure: LUMBAR FOUR-FIVE FOR MAXIMUM ACCESS (MAS) POSTERIOR LUMBAR INTERBODY FUSION (PLIF) 1 LEVEL masPLIF;  Surgeon: Tia Alert, MD;  Location: [facility] OR;  Service: Neurosurgery;  Laterality: N/A;        Home Medications    Prior to Admission medications   Medication Sig Start Date End Date Taking? Authorizing Provider  cephALEXin (KEFLEX) 500 MG capsule Take 1 capsule (500 mg  total) by mouth 4 (four) times daily. 07/24/17   Maxwell Caul, PA-C  omeprazole (PRILOSEC) 20 MG capsule Take 1 capsule (20 mg total) by mouth daily. 04/14/16   Elvina Sidle, MD  oxyCODONE 10 MG TABS Take 1 tablet (10 mg total) by mouth every 4 (four) hours as needed for moderate pain. 03/07/16   Tia Alert, MD  tiZANidine (ZANAFLEX) 4 MG tablet Take 4 mg by mouth every 8 (eight) hours as needed for muscle spasms.    [provider]    Family History No family history on file.  Social History Social History   Tobacco Use  . Smoking status: Current Every Day Smoker    Packs/day: 2.00    Types: Cigarettes  . Smokeless tobacco: Never Used  Substance Use Topics  . Alcohol use: No  . Drug use: No     Allergies   No known allergies   Review of Systems Review of Systems  Constitutional: Negative for fever.  Skin: Positive for wound.  Neurological: Negative for weakness and numbness.     Physical Exam Updated Vital Signs BP 129/82   Pulse 76   Temp 98.6 F (37 C)   Resp 19   SpO2 98%   Physical Exam  Constitutional: He appears well-developed and well-nourished.  HENT:  Head: Normocephalic and atraumatic.  Eyes:  Conjunctivae and EOM are normal. Right eye exhibits no discharge. Left eye exhibits no discharge. No scleral icterus.  Pulmonary/Chest: Effort normal.  Neurological: He is alert.  Skin: Skin is warm and dry.  There is a small 1 cm superficial wound noted to the medial aspect of the distal right lower extremity just above the ankle.  There is no surrounding warmth, erythema.  No fluctuance noted.  There is a small area of scabbed over region and skin noted in the middle.  No evidence of foreign body, insect.  Psychiatric: He has a normal mood and affect. His speech is normal and behavior is normal.  Nursing note and vitals reviewed.    ED Treatments / Results  Labs (all labs ordered are listed, but only abnormal results are displayed) Labs  Reviewed - No data to display  EKG None  Radiology No results found.  Procedures Procedures (including critical care time)  Medications Ordered in ED Medications  bacitracin ointment (has no administration in time range)     Initial Impression / Assessment and Plan / ED Course  I have reviewed the triage vital signs and the nursing notes.  Pertinent labs & imaging results that were available during my care of the patient were reviewed by me and considered in my medical decision making (see chart for details).     52 year old male who presents for evaluation of wound to right lower extremity that began last night.  Does not recall what happened.  Unsure if he hit it on something or if it insect bit him.  Triage note reports that he thought there was a taking it.  Patient states that he did not actually ever visualize a tick being on the leg.  Patient states that he saw something dark in it and states that he was concerned that it may be a tick.  No fevers, numbness/weakness. Patient is afebrile, non-toxic appearing, sitting comfortably on examination table. Vital signs reviewed and stable.  On exam, he has a small 1 cm superficial wound noted to the medial aspect of the distal right lower extremity just above the ankle.  There is no surrounding signs of infection.  No evidence of retained foreign body or insect.  Explored the wound after cleaning it with Shur-Clens.  There is no evidence of foreign body.  Exploration with blunt forcep shows that it is very superficial and does not extend deep.  There is no obvious evidence of fluctuance or abscess.  Wife is very concerned about a small area in the middle that appears dark.  On my evaluation, this appears to be dried skin with scabbed over blood.  I again irrigated the wound and saw no evidence of retained foreign body or insect.  I do not suspect that this is a retained tick based on history/physical exam.  Patient and wife are very  concerned about the wound becoming worse.  Given concerns, will start patient on antibiotic therapy.  No known drug allergies.  Wound care precautions discussed with patient and wife. Patient had ample opportunity for questions and discussion. All patient's questions were answered with full understanding. Strict return precautions discussed. Patient expresses understanding and agreement to plan.   Final Clinical Impressions(s) / ED Diagnoses   Final diagnoses:  Wound of right lower extremity, initial encounter    ED Discharge Orders        Ordered    cephALEXin (KEFLEX) 500 MG capsule  4 times daily     07/24/17 1610  Maxwell CaulLayden, Zakarie Sturdivant A, PA-C 07/24/17 [phone]    Geoffery Lyonselo, Douglas, MD 07/24/17 [phone]

## 2017-07-24 NOTE — Discharge Instructions (Signed)
You can take Tylenol or Ibuprofen as directed for pain. You can alternate Tylenol and Ibuprofen every 4 hours. If you take Tylenol at 1pm, then you can take Ibuprofen at 5pm. Then you can take Tylenol again at 9pm.   Elevate the leg and apply ice to help with pain and swelling.   Take antibiotics as directed. Please take all of your antibiotics until finished.  Follow-up with your primary care doctor in the next 2-4 days for further evaluation.   Monitor the wound closely.  Return to the emergency department for any worsening redness or swelling that begins to spread around the leg, drainage from the site, fevers, any swelling of the wound or any other worsening or concerning symptoms.

## 2020-12-11 ENCOUNTER — Other Ambulatory Visit: Payer: Self-pay | Admitting: Family Medicine

## 2020-12-11 DIAGNOSIS — R2242 Localized swelling, mass and lump, left lower limb: Secondary | ICD-10-CM

## 2020-12-14 ENCOUNTER — Other Ambulatory Visit: Payer: Self-pay

## 2020-12-14 ENCOUNTER — Ambulatory Visit
Admission: RE | Admit: 2020-12-14 | Discharge: 2020-12-14 | Disposition: A | Discharge status: Home / Self Care | Payer: Medicare Other | Source: Ambulatory Visit | Attending: Family Medicine | Admitting: Family Medicine

## 2020-12-14 DIAGNOSIS — R2242 Localized swelling, mass and lump, left lower limb: Secondary | ICD-10-CM

## 2020-12-26 ENCOUNTER — Other Ambulatory Visit: Payer: Self-pay | Admitting: Family Medicine

## 2020-12-26 DIAGNOSIS — R2242 Localized swelling, mass and lump, left lower limb: Secondary | ICD-10-CM

## 2020-12-27 ENCOUNTER — Other Ambulatory Visit: Payer: Self-pay | Admitting: Family Medicine

## 2020-12-27 DIAGNOSIS — R2242 Localized swelling, mass and lump, left lower limb: Secondary | ICD-10-CM

## 2021-01-24 ENCOUNTER — Ambulatory Visit
Admission: RE | Admit: 2021-01-24 | Discharge: 2021-01-24 | Disposition: A | Discharge status: Home / Self Care | Payer: Medicare Other | Source: Ambulatory Visit | Attending: Family Medicine | Admitting: Family Medicine

## 2021-01-24 ENCOUNTER — Other Ambulatory Visit: Payer: Self-pay

## 2021-01-24 DIAGNOSIS — R2242 Localized swelling, mass and lump, left lower limb: Secondary | ICD-10-CM

## 2021-01-24 MED ORDER — GADOBENATE DIMEGLUMINE 529 MG/ML IV SOLN: 19.0000 mL | Freq: Once | INTRAVENOUS | Status: DC | PRN

## 2021-02-05 ENCOUNTER — Other Ambulatory Visit: Payer: Self-pay | Admitting: Surgical Oncology

## 2021-02-06 ENCOUNTER — Other Ambulatory Visit: Payer: Self-pay | Admitting: Family Medicine

## 2021-02-06 ENCOUNTER — Other Ambulatory Visit: Payer: Self-pay | Admitting: Neurology

## 2021-02-06 ENCOUNTER — Other Ambulatory Visit: Payer: Self-pay | Admitting: Surgical Oncology

## 2021-02-06 DIAGNOSIS — R2242 Localized swelling, mass and lump, left lower limb: Secondary | ICD-10-CM

## 2021-03-27 ENCOUNTER — Ambulatory Visit
Admission: RE | Admit: 2021-03-27 | Discharge: 2021-03-27 | Disposition: A | Discharge status: Home / Self Care | Payer: Medicare Other | Source: Ambulatory Visit | Attending: Surgical Oncology | Admitting: Surgical Oncology

## 2021-03-27 ENCOUNTER — Inpatient Hospital Stay: Admission: RE | Admit: 2021-03-27 | Payer: Medicare Other | Source: Ambulatory Visit

## 2021-03-27 ENCOUNTER — Other Ambulatory Visit: Payer: Self-pay

## 2021-03-27 DIAGNOSIS — R2242 Localized swelling, mass and lump, left lower limb: Secondary | ICD-10-CM

## 2021-03-27 MED ORDER — GADOBENATE DIMEGLUMINE 529 MG/ML IV SOLN
20.0000 mL | Freq: Once | INTRAVENOUS | Status: AC | PRN
  Administered 2021-03-27: 20 mL via INTRAVENOUS

## 2021-03-29 ENCOUNTER — Other Ambulatory Visit: Payer: Medicare Other

## 2022-01-25 ENCOUNTER — Other Ambulatory Visit: Payer: Self-pay

## 2022-01-25 ENCOUNTER — Emergency Department (HOSPITAL_COMMUNITY)
Admission: EM | Admit: 2022-01-25 | Discharge: 2022-01-25 | Disposition: A | Discharge status: Home / Self Care | Payer: Medicare Other | Attending: Emergency Medicine | Admitting: Emergency Medicine

## 2022-01-25 DIAGNOSIS — M5441 Lumbago with sciatica, right side: Secondary | ICD-10-CM | POA: Insufficient documentation

## 2022-01-25 DIAGNOSIS — M5442 Lumbago with sciatica, left side: Secondary | ICD-10-CM | POA: Insufficient documentation

## 2022-01-25 DIAGNOSIS — M545 Low back pain, unspecified: Secondary | ICD-10-CM | POA: Diagnosis present

## 2022-01-25 MED ORDER — HYDROMORPHONE HCL 1 MG/ML IJ SOLN
1.0000 mg | Freq: Once | INTRAMUSCULAR | Status: AC
  Administered 2022-01-25: 1 mg via INTRAVENOUS
  Filled 2022-01-25: qty 1

## 2022-01-25 MED ORDER — KETOROLAC TROMETHAMINE 30 MG/ML IJ SOLN
30.0000 mg | Freq: Once | INTRAMUSCULAR | Status: AC
Start: 2022-01-25 — End: 2022-01-25
  Administered 2022-01-25: 30 mg via INTRAVENOUS
  Filled 2022-01-25: qty 1

## 2022-01-25 MED ORDER — PREDNISONE 20 MG PO TABS: ORAL_TABLET | ORAL | 0 refills | Status: DC

## 2022-01-25 MED ORDER — OXYCODONE-ACETAMINOPHEN 5-325 MG PO TABS: 1.0000 | ORAL_TABLET | ORAL | 0 refills | Status: AC | PRN

## 2022-01-25 MED ORDER — ONDANSETRON HCL 4 MG/2ML IJ SOLN
4.0000 mg | Freq: Once | INTRAMUSCULAR | Status: AC
  Administered 2022-01-25: 4 mg via INTRAVENOUS
  Filled 2022-01-25: qty 2

## 2022-01-25 MED ORDER — DEXAMETHASONE SODIUM PHOSPHATE 10 MG/ML IJ SOLN
10.0000 mg | Freq: Once | INTRAMUSCULAR | Status: AC
  Administered 2022-01-25: 10 mg via INTRAVENOUS
  Filled 2022-01-25: qty 1

## 2022-01-25 MED ORDER — METHOCARBAMOL 500 MG PO TABS: 500.0000 mg | ORAL_TABLET | Freq: Three times a day (TID) | ORAL | 0 refills | Status: DC | PRN

## 2022-01-25 NOTE — ED Provider Notes (Signed)
[facility] EMERGENCY DEPARTMENT Provider Note   CSN: [id number] Arrival date & time: 01/25/22  4098     History  Chief Complaint  Patient presents with   Back Pain    Timothy Massey is a 56 y.o. male.  Patient presents to the emergency department for evaluation of back pain radiating down both legs.  Patient reports that symptoms have been ongoing for 3 or 4 days.  Pain has progressively worsened over time.  Initially he thought it was the cold weather or arthritis.  He does report a prior history of lumbar surgery.  Patient denies any injury.  Pain is now severe.  Has not noticed any change in sensation, weakness, bowel or bladder function.       Home Medications Prior to Admission medications   Medication Sig Start Date End Date Taking? Authorizing Provider  cephALEXin (KEFLEX) 500 MG capsule Take 1 capsule (500 mg total) by mouth 4 (four) times daily. 07/24/17   Maxwell Caul, PA-C  omeprazole (PRILOSEC) 20 MG capsule Take 1 capsule (20 mg total) by mouth daily. 04/14/16   Elvina Sidle, MD  oxyCODONE 10 MG TABS Take 1 tablet (10 mg total) by mouth every 4 (four) hours as needed for moderate pain. 03/07/16   Tia Alert, MD  tiZANidine (ZANAFLEX) 4 MG tablet Take 4 mg by mouth every 8 (eight) hours as needed for muscle spasms.    [provider]      Allergies    No known allergies and Penicillins    Review of Systems   Review of Systems  Physical Exam Updated Vital Signs BP (!) 123/90 (BP Location: Right Arm)   Pulse 78   Temp 98.3 F (36.8 C)   Resp 18   SpO2 96%  Physical Exam Vitals and nursing note reviewed.  Constitutional:      General: He is not in acute distress.    Appearance: He is well-developed.  HENT:     Head: Normocephalic and atraumatic.     Mouth/Throat:     Mouth: Mucous membranes are moist.  Eyes:     General: Vision grossly intact. Gaze aligned appropriately.     Extraocular Movements: Extraocular  movements intact.     Conjunctiva/sclera: Conjunctivae normal.  Cardiovascular:     Rate and Rhythm: Normal rate and regular rhythm.     Pulses: Normal pulses.     Heart sounds: Normal heart sounds, S1 normal and S2 normal. No murmur heard.    No friction rub. No gallop.  Pulmonary:     Effort: Pulmonary effort is normal. No respiratory distress.     Breath sounds: Normal breath sounds.  Abdominal:     Palpations: Abdomen is soft.     Tenderness: There is no abdominal tenderness. There is no guarding or rebound.     Hernia: No hernia is present.  Musculoskeletal:        General: No swelling.     Cervical back: Full passive range of motion without pain, normal range of motion and neck supple. No pain with movement, spinous process tenderness or muscular tenderness. Normal range of motion.     Lumbar back: Spasms and tenderness present. Positive right straight leg raise test and positive left straight leg raise test.     Right lower leg: No edema.     Left lower leg: No edema.     Comments: Radicular pain noted with straight leg raise to 30 degrees, normal strength, sensation.  No  foot drop.  No saddle anesthesia.  Skin:    General: Skin is warm and dry.     Capillary Refill: Capillary refill takes less than 2 seconds.     Findings: No ecchymosis, erythema, lesion or wound.  Neurological:     Mental Status: He is alert and oriented to person, place, and time.     GCS: GCS eye subscore is 4. GCS verbal subscore is 5. GCS motor subscore is 6.     Cranial Nerves: Cranial nerves 2-12 are intact.     Sensory: Sensation is intact.     Motor: Motor function is intact. No weakness or abnormal muscle tone.     Coordination: Coordination is intact.  Psychiatric:        Mood and Affect: Mood normal.        Speech: Speech normal.        Behavior: Behavior normal.     ED Results / Procedures / Treatments   Labs (all labs ordered are listed, but only abnormal results are displayed) Labs  Reviewed - No data to display  EKG None  Radiology No results found.  Procedures Procedures    Medications Ordered in ED Medications  HYDROmorphone (DILAUDID) injection 1 mg (has no administration in time range)  ondansetron (ZOFRAN) injection 4 mg (has no administration in time range)  ketorolac (TORADOL) 30 MG/ML injection 30 mg (has no administration in time range)    ED Course/ Medical Decision Making/ A&P                           Medical Decision Making Risk Prescription drug management.   Presents to the emergency department for evaluation of severe low back pain radiating to the legs.  No red flags on exam, patient has preserved strength and sensation.  No signs of cauda equina, negative foot drop, negative saddle anesthesia.  He has not had change in bowel or bladder function.  No recent trauma.  Will provide analgesia.        Final Clinical Impression(s) / ED Diagnoses Final diagnoses:  Acute bilateral low back pain with bilateral sciatica    Rx / DC Orders ED Discharge Orders     None         Gilda Crease, MD 01/25/22 [phone]

## 2022-01-25 NOTE — ED Triage Notes (Signed)
Patient reports low back pain radiating down to right buttocks/right leg for several days , denies injury , ambulatory .

## 2022-01-25 NOTE — ED Notes (Signed)
Pt walked with cane at baseline

## 2022-05-01 IMAGING — MR MR [PERSON_NAME] LOW WO/W CM*L*
5 of 9 series · 24 of 40 positions shown · IV contrast (multihance)
Comparison: Ultrasound left lower leg 12/14/2020

CLINICAL DATA: Left lower leg mass.

EXAM:
MRI OF LOWER LEFT EXTREMITY WITHOUT AND WITH CONTRAST
TECHNIQUE: Multiplanar, multisequence MR imaging of the left lower leg was
performed both before and after administration of intravenous
contrast.
CONTRAST:  20mL MULTIHANCE GADOBENATE DIMEGLUMINE 529 MG/ML IV SOLN

[Series 4: T1 · coronal · right · 3.0mm · 1.07mm/px · 4 of 36 slices shown (1 of 2)]
[im 1/36]
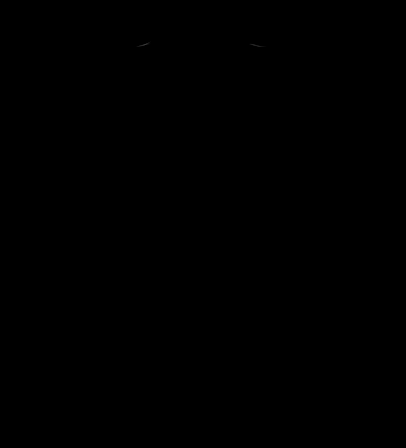
[im 12/36]
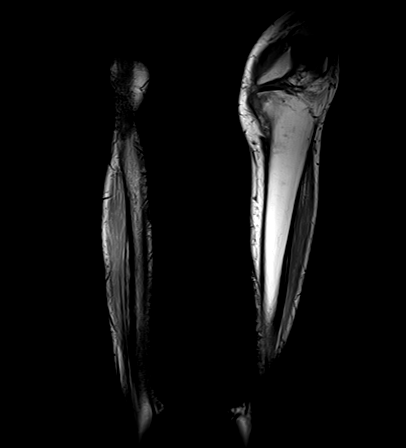
[im 24/36]
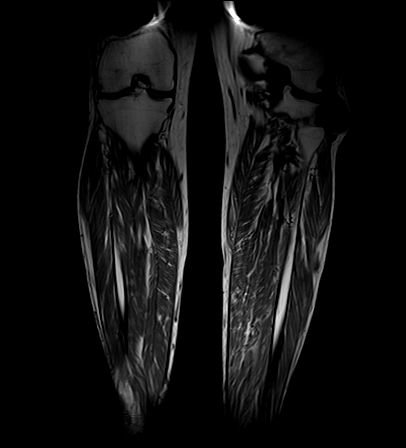
[im 36/36]
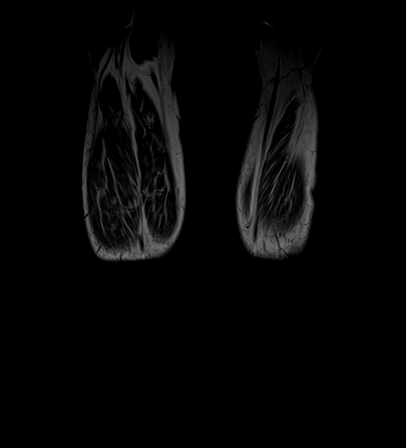

[Series 6: T1 · axial · right · 4.0mm · 0.31mm/px · z∈[-66,+208]mm · 5 of 56 slices shown (2 of 2)]
[im 1/56]
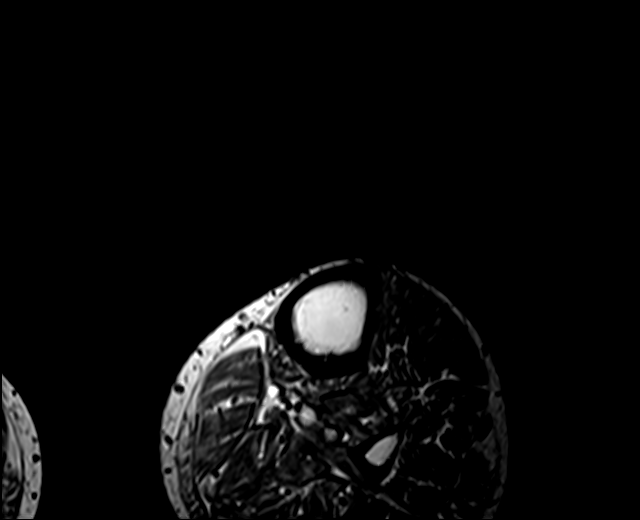
[im 14/56]
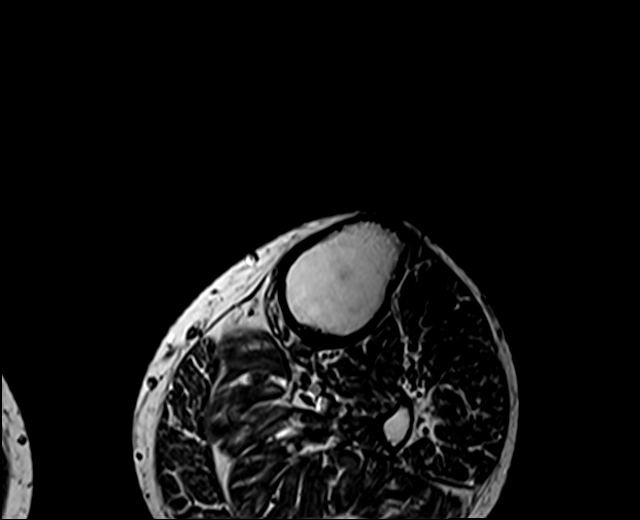
[im 28/56]
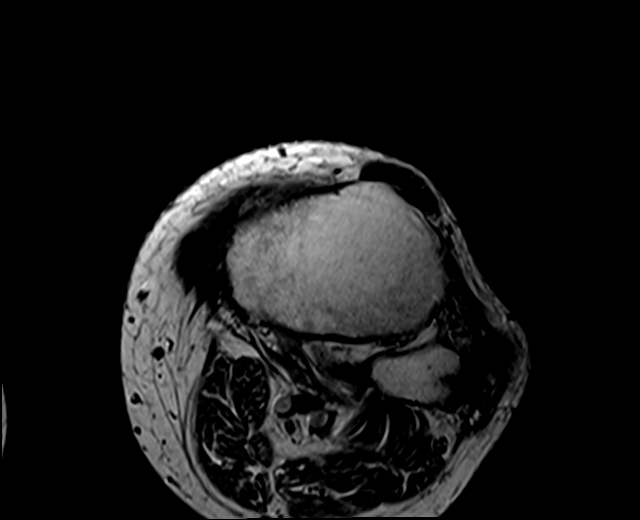
[im 42/56]
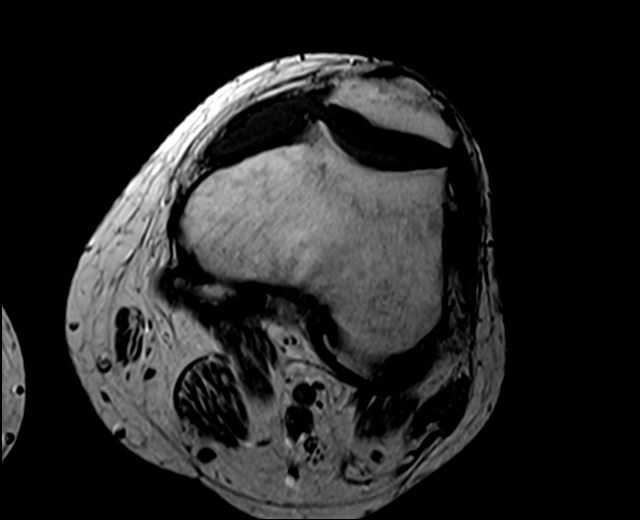
[im 56/56]
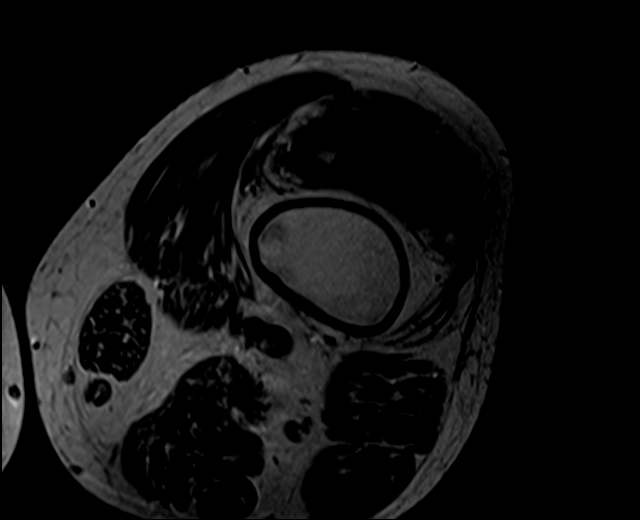

[Series 7: T2 fat-sat · axial · right · 4.0mm · 0.31mm/px · z∈[-66,+208]mm · 5 of 56 slices shown]
[im 1/56]
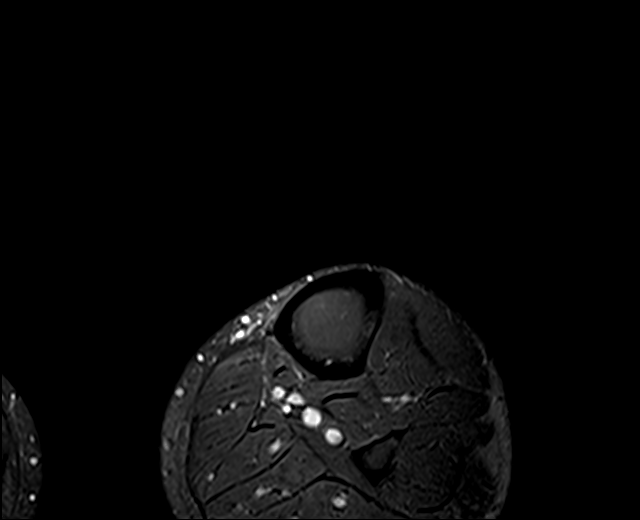
[im 14/56]
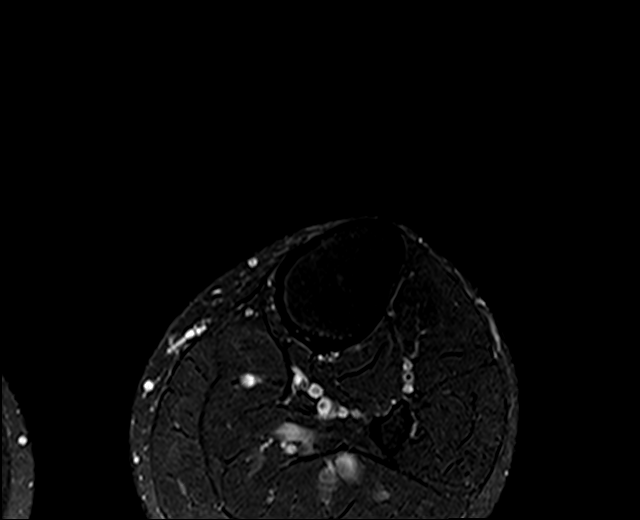
[im 28/56]
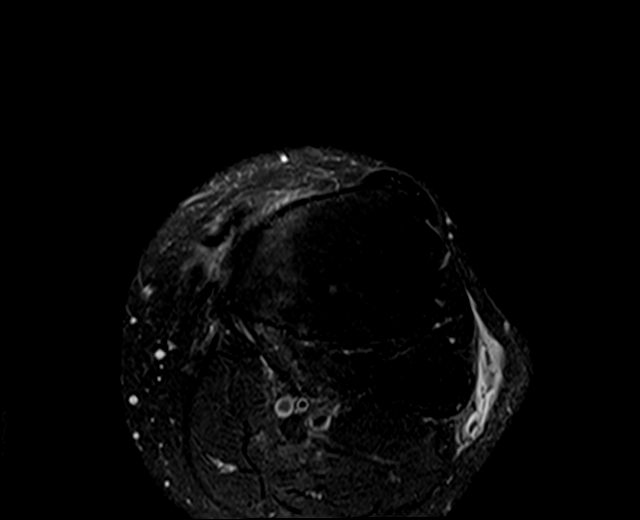
[im 42/56]
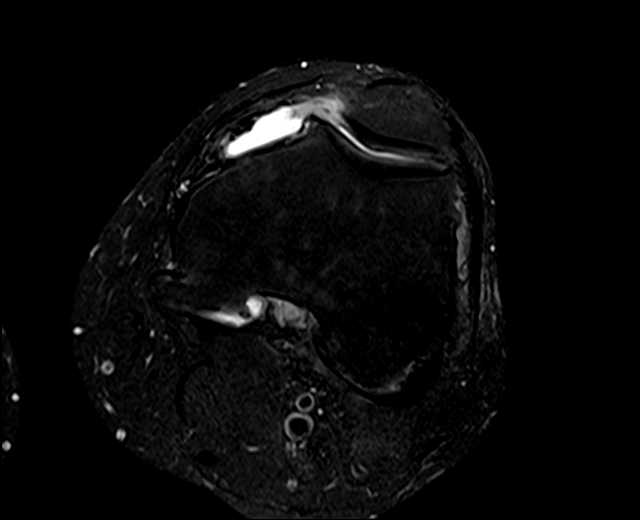
[im 56/56]
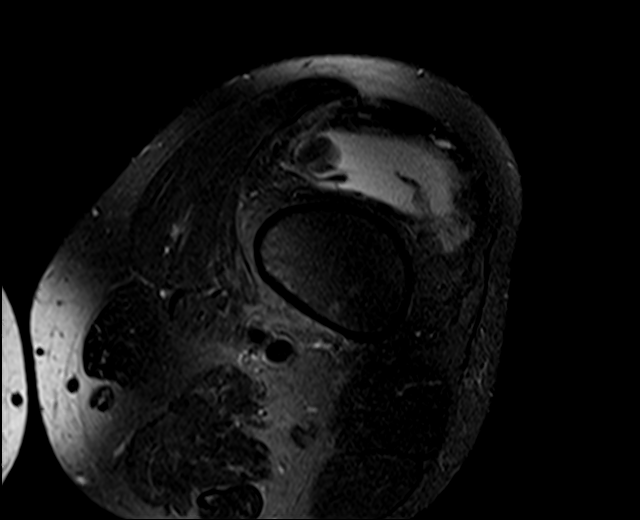

[Series 9: T1 fat-sat · axial · right · 4.0mm · 0.62mm/px · z∈[-66,+208]mm · 5 of 56 slices shown (1 of 2)]
[im 1/56]
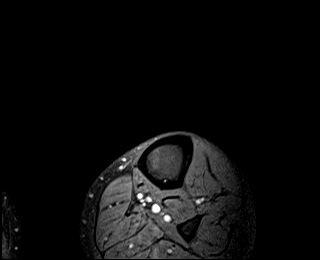
[im 14/56]
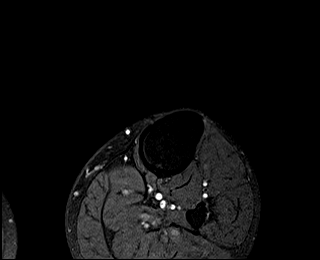
[im 28/56]
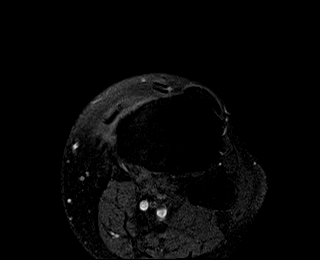
[im 42/56]
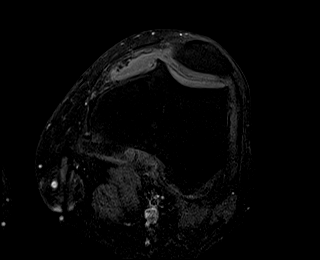
[im 56/56]
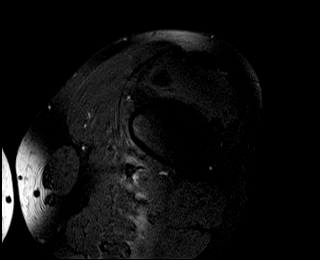

[Series 10: T1 fat-sat · axial · right · 4.0mm · 0.62mm/px · z∈[-66,+208]mm · 5 of 56 slices shown (2 of 2)]
[im 1/56]
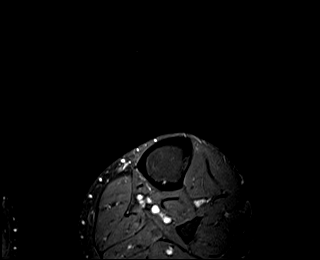
[im 14/56]
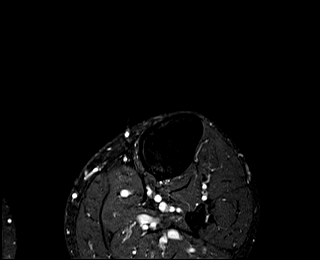
[im 28/56]
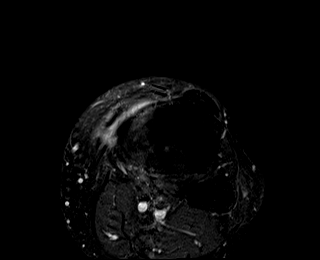
[im 42/56]
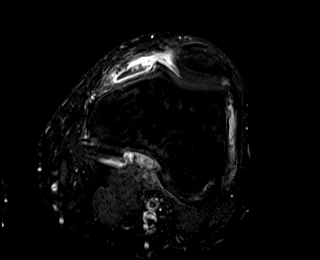
[im 56/56]
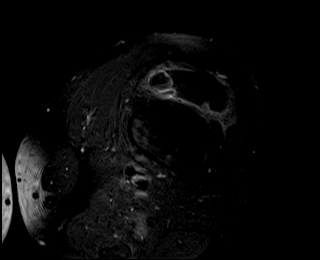

[24 of 40 positions shown; findings below may reference images not displayed]

FINDINGS: Bones/Joint/Cartilage

No fracture or dislocation. Normal alignment. Large knee joint
effusion with mild synovitis.

Partial-thickness cartilage loss of the left medial femorotibial
compartment. Partial-thickness cartilage loss of the lateral
femorotibial compartment with areas of full-thickness cartilage
loss. Large radial tear of the posterior horn of the lateral
meniscus of the left knee.

Ligaments

Collateral ligaments are intact.

Muscles and Tendons

Muscles are normal.

Soft tissue
No fluid collection or hematoma.  No soft tissue mass.

2.5 x 1 x 5.1 cm loculated cystic mass along the medial aspect of
the medial tibial plateau on either side of the MCL most consistent
with a ganglion cyst with mild subcortical marrow edema in the
adjacent medial tibial plateau.

4.4 x 3.2 x 3.1 cm cystic nonenhancing mass along the posterolateral
margin of the left knee joint capsule with a few internal synechia
most consistent with a mildly complicated ganglion cyst.
IMPRESSION: 1. A 2.5 x 1 x 5.1 cm ganglion cyst along the medial aspect of the
medial tibial plateau on either side of the MCL with mild
subcortical marrow edema in the adjacent medial tibial plateau.
2. A 4.4 x 3.2 x 3.1 cm mildly complicated ganglion cyst along the
posterolateral margin of the left knee joint capsule.
3. Partial-thickness cartilage loss of the left medial femorotibial
compartment. Partial-thickness cartilage loss of the lateral
femorotibial compartment with areas of full-thickness cartilage
loss.
4. Large radial tear of the posterior horn of the lateral meniscus
of the left knee.

## 2022-12-13 ENCOUNTER — Emergency Department (HOSPITAL_COMMUNITY)
Admission: EM | Admit: 2022-12-13 | Discharge: 2022-12-13 | Disposition: A | Discharge status: Home / Self Care | Payer: Medicare Other | Attending: Student | Admitting: Student

## 2022-12-13 ENCOUNTER — Other Ambulatory Visit: Payer: Self-pay

## 2022-12-13 ENCOUNTER — Emergency Department (HOSPITAL_COMMUNITY): Payer: Medicare Other

## 2022-12-13 DIAGNOSIS — M545 Low back pain, unspecified: Secondary | ICD-10-CM | POA: Diagnosis present

## 2022-12-13 DIAGNOSIS — M5442 Lumbago with sciatica, left side: Secondary | ICD-10-CM | POA: Diagnosis not present

## 2022-12-13 DIAGNOSIS — M5432 Sciatica, left side: Secondary | ICD-10-CM

## 2022-12-13 DIAGNOSIS — F17210 Nicotine dependence, cigarettes, uncomplicated: Secondary | ICD-10-CM | POA: Diagnosis not present

## 2022-12-13 MED ORDER — GABAPENTIN 300 MG PO CAPS
400.0000 mg | ORAL_CAPSULE | Freq: Once | ORAL | Status: AC
  Administered 2022-12-13: 400 mg via ORAL
  Filled 2022-12-13: qty 1

## 2022-12-13 MED ORDER — DICLOFENAC SODIUM 1 % EX GEL
2.0000 g | Freq: Once | CUTANEOUS | Status: AC
  Administered 2022-12-13: 2 g via TOPICAL
  Filled 2022-12-13: qty 100

## 2022-12-13 MED ORDER — HYDROCODONE-ACETAMINOPHEN 5-325 MG PO TABS
1.0000 | ORAL_TABLET | Freq: Once | ORAL | Status: AC
  Administered 2022-12-13: 1 via ORAL
  Filled 2022-12-13: qty 1

## 2022-12-13 MED ORDER — KETOROLAC TROMETHAMINE 15 MG/ML IJ SOLN
15.0000 mg | Freq: Once | INTRAMUSCULAR | Status: AC
  Administered 2022-12-13: 15 mg via INTRAMUSCULAR
  Filled 2022-12-13: qty 1

## 2022-12-13 MED ORDER — LIDOCAINE 5 % EX PTCH
1.0000 | MEDICATED_PATCH | CUTANEOUS | Status: DC
  Administered 2022-12-13: 1 via TRANSDERMAL
  Filled 2022-12-13: qty 1

## 2022-12-13 NOTE — ED Provider Notes (Signed)
[place] EMERGENCY DEPARTMENT AT [facility] Provider Note  CSN: [id number] Arrival date & time: 12/13/22 4696  Chief Complaint(s) Left Hip Pain   HPI Timothy Massey is a 57 y.o. male with PMH sciatica who presents emergency department for evaluation of back and hip pain.  Has known back pain with symptoms worsening abruptly this morning.  Radiates down the back of the left leg into the ankle.  No associated saddle anesthesia, loss of bowel or bladder, numbness, weakness or other red flag signs of back pain.   Denies chest pain, shortness of breath, abdominal pain, nausea, vomiting, headache, fever or other systemic symptoms.   Past Medical History Past Medical History:  Diagnosis Date   Arthritis    GERD (gastroesophageal reflux disease)    Hernia, inguinal, right    Seizures (HCC)    one unknown reason   Patient Active Problem List   Diagnosis Date Noted   Intractable low back pain 03/04/2016   S/P lumbar spinal fusion 02/28/2016   Home Medication(s) Prior to Admission medications   Medication Sig Start Date End Date Taking? Authorizing Provider  methocarbamol (ROBAXIN) 500 MG tablet Take 1 tablet (500 mg total) by mouth every 8 (eight) hours as needed for muscle spasms. 01/25/22   Gilda Crease, MD  oxyCODONE-acetaminophen (PERCOCET) 5-325 MG tablet Take 1-2 tablets by mouth every 4 (four) hours as needed. 01/25/22   Gilda Crease, MD  predniSONE (DELTASONE) 20 MG tablet 3 tabs po daily x 3 days, then 2 tabs x 3 days, then 1.5 tabs x 3 days, then 1 tab x 3 days, then 0.5 tabs x 3 days 01/25/22   Gilda Crease, MD                                                                                                                                    Past Surgical History Past Surgical History:  Procedure Laterality Date   BACK SURGERY     HERNIA REPAIR     right groin   KNEE SURGERY     MAXIMUM ACCESS (MAS)POSTERIOR LUMBAR INTERBODY FUSION (PLIF)  1 LEVEL N/A 02/28/2016   Procedure: LUMBAR FOUR-FIVE FOR MAXIMUM ACCESS (MAS) POSTERIOR LUMBAR INTERBODY FUSION (PLIF) 1 LEVEL masPLIF;  Surgeon: Tia Alert, MD;  Location: [facility] OR;  Service: Neurosurgery;  Laterality: N/A;   Family History No family history on file.  Social History Social History   Tobacco Use   Smoking status: Every Day    Current packs/day: 2.00    Types: Cigarettes   Smokeless tobacco: Never  Substance Use Topics   Alcohol use: No   Drug use: No   Allergies No known allergies and Penicillins  Review of Systems Review of Systems  Musculoskeletal:  Positive for back pain.    Physical Exam Vital Signs  I have reviewed the triage vital signs BP (!) 131/98 (BP Location: Left Arm)  Pulse 90   Temp 98.1 F (36.7 C) (Oral)   SpO2 99%   Physical Exam Constitutional:      General: He is not in acute distress.    Appearance: Normal appearance.  HENT:     Head: Normocephalic and atraumatic.     Nose: No congestion or rhinorrhea.  Eyes:     General:        Right eye: No discharge.        Left eye: No discharge.     Extraocular Movements: Extraocular movements intact.     Pupils: Pupils are equal, round, and reactive to light.  Cardiovascular:     Rate and Rhythm: Normal rate and regular rhythm.     Heart sounds: No murmur heard. Pulmonary:     Effort: No respiratory distress.     Breath sounds: No wheezing or rales.  Abdominal:     General: There is no distension.     Tenderness: There is no abdominal tenderness.  Musculoskeletal:        General: Tenderness present. Normal range of motion.     Cervical back: Normal range of motion.  Skin:    General: Skin is warm and dry.  Neurological:     General: No focal deficit present.     Mental Status: He is alert.     ED Results and Treatments Labs (all labs ordered are listed, but only abnormal results are displayed) Labs Reviewed - No data to display                                                                                                                         Radiology No results found.  Pertinent labs & imaging results that were available during my care of the patient were reviewed by me and considered in my medical decision making (see MDM for details).  Medications Ordered in ED Medications  lidocaine (LIDODERM) 5 % 1 patch (has no administration in time range)  ketorolac (TORADOL) 15 MG/ML injection 15 mg (has no administration in time range)  HYDROcodone-acetaminophen (NORCO/VICODIN) 5-325 MG per tablet 1 tablet (has no administration in time range)                                                                                                                                     Procedures Procedures  (including critical care time)  Medical Decision Making /  ED Course   This patient presents to the ED for concern of hip and back pain, this involves an extensive number of treatment options, and is a complaint that carries with it a high risk of complications and morbidity.  The differential diagnosis includes muscular strain/spasm, lumbago, disc herniation, spinal fracture, cauda equina, epidural abscess or hematoma, psoas abscess, pyelonephritis  MDM: Patient seen emergency room for evaluation of back pain and hip pain.  Physical exam with tenderness in the L-spine and over the left hip but is otherwise unremarkable.  No saddle anesthesia.  No weakness on exam.  X-ray of the hip with no fracture.  Toradol, Norco and Lidoderm patch ordered for patient's pain.  Patient has known history of sciatica and this is likely an exacerbation of his underlying sciatica.  I have lower suspicion for cauda equina given otherwise benign neurologic exam.  At time of signout, patient pending reevaluation by oncoming provider after pain control.  Please see provider signout for continuation of workup.   Additional history obtained:  -External records from outside source obtained  and reviewed including: Chart review including previous notes, labs, imaging, consultation notes   Imaging Studies ordered: I ordered imaging studies including XR hip I independently visualized and interpreted imaging. I agree with the radiologist interpretation   Medicines ordered and prescription drug management: Meds ordered this encounter  Medications   lidocaine (LIDODERM) 5 % 1 patch   ketorolac (TORADOL) 15 MG/ML injection 15 mg   HYDROcodone-acetaminophen (NORCO/VICODIN) 5-325 MG per tablet 1 tablet    -I have reviewed the patients home medicines and have made adjustments as needed  Critical interventions none    Reevaluation: After the interventions noted above, I reevaluated the patient and found that they have :stayed the same  Co morbidities that complicate the patient evaluation  Past Medical History:  Diagnosis Date   Arthritis    GERD (gastroesophageal reflux disease)    Hernia, inguinal, right    Seizures (HCC)    one unknown reason      Dispostion: I considered admission for this patient, and disposition pending reevaluation by oncoming provider after pain control.  Please see provider signout continuation of workup     Final Clinical Impression(s) / ED Diagnoses Final diagnoses:  None     @PCDICTATION @    Glendora Score, MD 12/13/22 [phone]

## 2022-12-13 NOTE — ED Triage Notes (Signed)
Patient reports left hip pain radiating to left lower leg onset 2 days ago , denies injury .

## 2022-12-13 NOTE — ED Provider Notes (Signed)
  Physical Exam  BP (!) 131/98 (BP Location: Left Arm)   Pulse 90   Temp 98.1 F (36.7 C) (Oral)   Resp 16   SpO2 99%   Physical Exam Vitals and nursing note reviewed.  HENT:     Head: Normocephalic and atraumatic.  Eyes:     Pupils: Pupils are equal, round, and reactive to light.  Cardiovascular:     Rate and Rhythm: Normal rate and regular rhythm.  Pulmonary:     Effort: Pulmonary effort is normal.     Breath sounds: Normal breath sounds.  Abdominal:     Palpations: Abdomen is soft.     Tenderness: There is no abdominal tenderness.  Musculoskeletal:     Comments: Left hip tenderness over anterolateral aspect with reproducible pain down left lower extremity  Skin:    General: Skin is warm and dry.  Neurological:     Mental Status: He is alert.  Psychiatric:        Mood and Affect: Mood normal.     Procedures  Procedures  ED Course / MDM   Clinical Course as of 12/13/22 1043  Fri Dec 13, 2022  4098 Reevaluated patient.  Patient reports minimal improvement since application of lidocaine patch Toradol and Norco.  He is still uncomfortable appearing on his right side with persistent sciatic pain on the left.  Will try gabapentin and topical diclofenac to see if that provides any relief and reassess [MP]  1041 Reevaluated patient.  Patient reports improvement in his discomfort since application of diclofenac.  He is able to reposition himself easily in the bed now feels want to go home.  We will discharge him with the rest of the diclofenac tube instruction for PCP follow-up.  Return precautions discussed in detail. [MP]    Clinical Course User Index [MP] Royanne Foots, DO   Medical Decision Making I, Estelle June DO, have assumed care of this patient from the previous provider reevaluation of left sciatica and disposition  Amount and/or Complexity of Data Reviewed Radiology: ordered.  Risk Prescription drug management.   Final diagnosis Sciatica of left  side       Royanne Foots, DO 12/13/22 1043

## 2022-12-13 NOTE — ED Notes (Signed)
Patient discharged by this RN. Patient verbalizes understanding of instructions with no additional questions. Patient ambulatory with cane to vehicle.

## 2022-12-13 NOTE — Discharge Instructions (Signed)
You were seen in the emergency department for sciatic pain on the left We tried a few different medications but it seemed that the Voltaren gel helped to the most You can apply this medication as directed if that helps with your sciatic leg pain Please follow-up with your primary care doctor regarding your visit today and for reevaluation within next week Return to the emerged part for severe pain, if you are unable to walk or have any other concerns
